# Patient Record
Sex: Female | Born: 1935
Health system: Southern US, Community
[De-identification: ages and names within clinical notes are randomized; demographics above are authoritative.]

## PROBLEM LIST (undated history)

## (undated) DIAGNOSIS — I10 Essential (primary) hypertension: Secondary | ICD-10-CM

## (undated) DIAGNOSIS — E785 Hyperlipidemia, unspecified: Secondary | ICD-10-CM

## (undated) DIAGNOSIS — M1990 Unspecified osteoarthritis, unspecified site: Secondary | ICD-10-CM

## (undated) DIAGNOSIS — Z973 Presence of spectacles and contact lenses: Secondary | ICD-10-CM

## (undated) HISTORY — PX: ABDOMINAL HYSTERECTOMY: SHX81

---

## 1999-03-22 ENCOUNTER — Ambulatory Visit (HOSPITAL_COMMUNITY): Admission: RE | Admit: 1999-03-22 | Discharge: 1999-03-22 | Payer: Self-pay | Admitting: Family Medicine

## 1999-03-22 ENCOUNTER — Encounter: Payer: Self-pay | Admitting: Family Medicine

## 1999-03-27 ENCOUNTER — Encounter: Payer: Self-pay | Admitting: Family Medicine

## 1999-03-27 ENCOUNTER — Ambulatory Visit (HOSPITAL_COMMUNITY): Admission: RE | Admit: 1999-03-27 | Discharge: 1999-03-27 | Payer: Self-pay | Admitting: Family Medicine

## 1999-10-09 ENCOUNTER — Other Ambulatory Visit: Admission: RE | Admit: 1999-10-09 | Discharge: 1999-10-09 | Payer: Self-pay | Admitting: Family Medicine

## 2000-01-01 ENCOUNTER — Other Ambulatory Visit: Admission: RE | Admit: 2000-01-01 | Discharge: 2000-01-01 | Payer: Self-pay | Admitting: Family Medicine

## 2000-01-01 ENCOUNTER — Encounter: Payer: Self-pay | Admitting: Family Medicine

## 2000-01-01 ENCOUNTER — Encounter: Admission: RE | Admit: 2000-01-01 | Discharge: 2000-01-01 | Payer: Self-pay | Admitting: Family Medicine

## 2000-10-14 ENCOUNTER — Encounter: Admission: RE | Admit: 2000-10-14 | Discharge: 2001-01-12 | Payer: Self-pay | Admitting: Family Medicine

## 2002-06-16 ENCOUNTER — Encounter: Payer: Self-pay | Admitting: Family Medicine

## 2002-06-16 ENCOUNTER — Encounter: Admission: RE | Admit: 2002-06-16 | Discharge: 2002-06-16 | Payer: Self-pay | Admitting: Family Medicine

## 2006-07-29 ENCOUNTER — Encounter: Admission: RE | Admit: 2006-07-29 | Discharge: 2006-07-29 | Payer: Self-pay | Admitting: Family Medicine

## 2006-08-01 ENCOUNTER — Encounter: Admission: RE | Admit: 2006-08-01 | Discharge: 2006-08-01 | Payer: Self-pay | Admitting: Family Medicine

## 2007-02-16 ENCOUNTER — Encounter: Admission: RE | Admit: 2007-02-16 | Discharge: 2007-02-16 | Payer: Self-pay | Admitting: Family Medicine

## 2007-08-24 ENCOUNTER — Ambulatory Visit (HOSPITAL_COMMUNITY): Admission: RE | Admit: 2007-08-24 | Discharge: 2007-08-24 | Payer: Self-pay | Admitting: Ophthalmology

## 2008-04-25 ENCOUNTER — Emergency Department (HOSPITAL_COMMUNITY): Admission: EM | Admit: 2008-04-25 | Discharge: 2008-04-25 | Payer: Self-pay | Admitting: Emergency Medicine

## 2010-11-08 LAB — CREATININE, SERUM: GFR calc non Af Amer: 50 — ABNORMAL LOW

## 2011-10-30 ENCOUNTER — Encounter (HOSPITAL_BASED_OUTPATIENT_CLINIC_OR_DEPARTMENT_OTHER): Payer: Self-pay | Admitting: *Deleted

## 2011-10-30 NOTE — Progress Notes (Signed)
To come in for bmet-ekg Take only bp meds am of surgery-bring all meds

## 2011-10-31 ENCOUNTER — Encounter (HOSPITAL_BASED_OUTPATIENT_CLINIC_OR_DEPARTMENT_OTHER)
Admission: RE | Admit: 2011-10-31 | Discharge: 2011-10-31 | Disposition: A | Discharge status: Home / Self Care | Payer: Medicare Other | Source: Ambulatory Visit | Attending: Orthopedic Surgery | Admitting: Orthopedic Surgery

## 2011-10-31 LAB — BASIC METABOLIC PANEL
CO2: 24 mEq/L (ref 19–32)
Chloride: 102 mEq/L (ref 96–112)
GFR calc Af Amer: 74 mL/min — ABNORMAL LOW (ref >90)
Potassium: 3.8 mEq/L (ref 3.5–5.1)
Sodium: 136 mEq/L (ref 135–145)

## 2011-10-31 NOTE — Progress Notes (Signed)
Compared to old ekg form dr blands office no change,  ekg reviewed by dr crews cleared ekg

## 2011-11-04 NOTE — H&P (Signed)
  Teresa Wu is an 78 y.o. female.   Chief Complaint: c/o chronic and progressive numbness and tingling of the right hand both median and ulnar distribution   HPI: She has had a year history of numbness in her hands.  She has more numbness on the right than the left.  She has had type II diabetes for ten years. She is not sure what her last hemoglobin A1C was, but states it was "7 something".  She has history of hypertension, no history of renal disease.  She is unaware of any neuropathy symptoms in her feet.    Past Medical History  Diagnosis Date  . Hypertension   . Diabetes mellitus   . Arthritis   . Hyperlipemia   . Wears glasses     Past Surgical History  Procedure Date  . Abdominal hysterectomy     No family history on file. Social History:  reports that she has never smoked. She does not have any smokeless tobacco history on file. She reports that she does not drink alcohol or use illicit drugs.  Allergies: No Known Allergies  No prescriptions prior to admission    No results found for this or any previous visit (from the past 48 hour(s)).  No results found.   Pertinent items are noted in HPI.  Height 5\' 2"  (1.575 m), weight 75.751 kg (167 lb).  General appearance: alert Head: Normocephalic, without obvious abnormality Neck: supple, symmetrical, trachea midline Resp: clear to auscultation bilaterally Cardio: regular rate and rhythm GI: normal findings: bowel sounds normal Extremities:  Inspection of her hands reveals no thenar atrophy.  She has full range of motion of her fingers in flexion/extension.  There is no sign of stenosing tenosynovitis.  She has positive wrist flexion test bilaterally, positive Tinel's sign over the median nerve bilaterally. She does not have intrinsic atrophy.    She has osteoarthritis of multiple joints most notably at her thumb IP joint on the right and her ring ringer PIP joint.  She can close her fingers to the palm on the left and  on the right she cannot close her ring fingertip to the palm due to osteoarthritis at the ring finger PIP joint.  Plain films of her hands demonstrate diffuse osteoarthritis most notable at the ring finger PIP joint on the right and IP joints of the thumbs bilaterally.  Her carpus is well preserved bilaterally.  Dr. Johna Roles completed detailed electrodiagnostic studies. These revealed generalized neuropathy with slowing of the median and ulnar conductions. She has very significant slowing across her carpal tunnels bilaterally and moderately slowing across the cubital tunnel bilaterally.    Pulses: 2+ and symmetric Skin: normal Neurologic: Grossly normal    Assessment/Plan Impression:Bilateral carpal tunnel syndrome.Probable mild ulnar neuropathy vs. motor sensory                    polyneuropathy.  Plan: To the OR for right CTR and decompression ulnar nerve right cubital tunnel.The procedure, risks,benefits and post-op course were discussed with the patient at length and they were in agreement with the plan.     DASNOIT,Jonluke Cobbins J 11/04/2011, 5:01 PM     H&P documentation: 11/05/2011  -History and Physical Reviewed  -Patient has been re-examined  -No change in the plan of care  Wyn Forster, MD

## 2011-11-05 ENCOUNTER — Encounter (HOSPITAL_BASED_OUTPATIENT_CLINIC_OR_DEPARTMENT_OTHER)
Admission: RE | Disposition: A | Discharge status: Home / Self Care | Payer: Self-pay | Source: Ambulatory Visit | Attending: Orthopedic Surgery

## 2011-11-05 ENCOUNTER — Encounter (HOSPITAL_BASED_OUTPATIENT_CLINIC_OR_DEPARTMENT_OTHER): Payer: Self-pay | Admitting: Certified Registered"

## 2011-11-05 ENCOUNTER — Ambulatory Visit (HOSPITAL_BASED_OUTPATIENT_CLINIC_OR_DEPARTMENT_OTHER)
Admission: RE | Admit: 2011-11-05 | Discharge: 2011-11-05 | Disposition: A | Discharge status: Home / Self Care | Payer: Medicare Other | Source: Ambulatory Visit | Attending: Orthopedic Surgery | Admitting: Orthopedic Surgery

## 2011-11-05 ENCOUNTER — Ambulatory Visit (HOSPITAL_BASED_OUTPATIENT_CLINIC_OR_DEPARTMENT_OTHER): Payer: Medicare Other | Admitting: Certified Registered"

## 2011-11-05 ENCOUNTER — Encounter (HOSPITAL_BASED_OUTPATIENT_CLINIC_OR_DEPARTMENT_OTHER): Payer: Self-pay

## 2011-11-05 DIAGNOSIS — G5600 Carpal tunnel syndrome, unspecified upper limb: Secondary | ICD-10-CM | POA: Insufficient documentation

## 2011-11-05 DIAGNOSIS — Z01810 Encounter for preprocedural cardiovascular examination: Secondary | ICD-10-CM | POA: Insufficient documentation

## 2011-11-05 DIAGNOSIS — G5620 Lesion of ulnar nerve, unspecified upper limb: Secondary | ICD-10-CM | POA: Insufficient documentation

## 2011-11-05 DIAGNOSIS — Z01812 Encounter for preprocedural laboratory examination: Secondary | ICD-10-CM | POA: Insufficient documentation

## 2011-11-05 DIAGNOSIS — E785 Hyperlipidemia, unspecified: Secondary | ICD-10-CM | POA: Insufficient documentation

## 2011-11-05 DIAGNOSIS — E1142 Type 2 diabetes mellitus with diabetic polyneuropathy: Secondary | ICD-10-CM | POA: Insufficient documentation

## 2011-11-05 DIAGNOSIS — I10 Essential (primary) hypertension: Secondary | ICD-10-CM | POA: Insufficient documentation

## 2011-11-05 DIAGNOSIS — E1149 Type 2 diabetes mellitus with other diabetic neurological complication: Secondary | ICD-10-CM | POA: Insufficient documentation

## 2011-11-05 HISTORY — PX: ULNAR NERVE TRANSPOSITION: SHX2595

## 2011-11-05 HISTORY — PX: CARPAL TUNNEL RELEASE: SHX101

## 2011-11-05 HISTORY — DX: Hyperlipidemia, unspecified: E78.5

## 2011-11-05 HISTORY — DX: Essential (primary) hypertension: I10

## 2011-11-05 HISTORY — DX: Presence of spectacles and contact lenses: Z97.3

## 2011-11-05 HISTORY — DX: Unspecified osteoarthritis, unspecified site: M19.90

## 2011-11-05 LAB — GLUCOSE, CAPILLARY: Glucose-Capillary: 146 mg/dL — ABNORMAL HIGH (ref 70–99)

## 2011-11-05 SURGERY — CARPAL TUNNEL RELEASE
Anesthesia: General | Site: Hand | Laterality: Right | Wound class: Clean

## 2011-11-05 MED ORDER — MIDAZOLAM HCL 2 MG/2ML IJ SOLN: 1.0000 mg | INTRAMUSCULAR | Status: DC | PRN

## 2011-11-05 MED ORDER — HYDROMORPHONE HCL PF 1 MG/ML IJ SOLN: 0.2500 mg | INTRAMUSCULAR | Status: DC | PRN

## 2011-11-05 MED ORDER — PROMETHAZINE HCL 25 MG/ML IJ SOLN: 6.2500 mg | INTRAMUSCULAR | Status: DC | PRN

## 2011-11-05 MED ORDER — HYDROCODONE-ACETAMINOPHEN 5-325 MG PO TABS: ORAL_TABLET | ORAL | Status: DC

## 2011-11-05 MED ORDER — FENTANYL CITRATE 0.05 MG/ML IJ SOLN
INTRAMUSCULAR | Status: DC | PRN
  Administered 2011-11-05: 100 ug via INTRAVENOUS
  Administered 2011-11-05 (×2): 25 ug via INTRAVENOUS

## 2011-11-05 MED ORDER — LACTATED RINGERS IV SOLN
INTRAVENOUS | Status: DC
  Administered 2011-11-05: 09:00:00 via INTRAVENOUS

## 2011-11-05 MED ORDER — LIDOCAINE HCL 2 % IJ SOLN
INTRAMUSCULAR | Status: DC | PRN
  Administered 2011-11-05: 5 mL

## 2011-11-05 MED ORDER — EPHEDRINE SULFATE 50 MG/ML IJ SOLN
INTRAMUSCULAR | Status: DC | PRN
  Administered 2011-11-05: 10 mg via INTRAVENOUS

## 2011-11-05 MED ORDER — PROPOFOL 10 MG/ML IV BOLUS
INTRAVENOUS | Status: DC | PRN
  Administered 2011-11-05: 140 mg via INTRAVENOUS

## 2011-11-05 MED ORDER — ONDANSETRON HCL 4 MG/2ML IJ SOLN
INTRAMUSCULAR | Status: DC | PRN
  Administered 2011-11-05: 4 mg via INTRAVENOUS

## 2011-11-05 MED ORDER — LIDOCAINE HCL (CARDIAC) 20 MG/ML IV SOLN
INTRAVENOUS | Status: DC | PRN
  Administered 2011-11-05: 60 mg via INTRAVENOUS

## 2011-11-05 MED ORDER — FENTANYL CITRATE 0.05 MG/ML IJ SOLN: 50.0000 ug | Freq: Once | INTRAMUSCULAR | Status: DC

## 2011-11-05 SURGICAL SUPPLY — 49 items
BANDAGE ADHESIVE 1X3 (GAUZE/BANDAGES/DRESSINGS) IMPLANT
BANDAGE ELASTIC 3 VELCRO ST LF (GAUZE/BANDAGES/DRESSINGS) ×3 IMPLANT
BANDAGE ELASTIC 4 VELCRO ST LF (GAUZE/BANDAGES/DRESSINGS) ×3 IMPLANT
BLADE MINI RND TIP GREEN BEAV (BLADE) IMPLANT
BLADE SURG 15 STRL LF DISP TIS (BLADE) ×2 IMPLANT
BLADE SURG 15 STRL SS (BLADE) ×1
BNDG ESMARK 4X9 LF (GAUZE/BANDAGES/DRESSINGS) ×3 IMPLANT
BRUSH SCRUB EZ PLAIN DRY (MISCELLANEOUS) ×3 IMPLANT
CLOTH BEACON ORANGE TIMEOUT ST (SAFETY) ×3 IMPLANT
CORDS BIPOLAR (ELECTRODE) ×3 IMPLANT
COVER MAYO STAND STRL (DRAPES) ×3 IMPLANT
COVER TABLE BACK 60X90 (DRAPES) ×3 IMPLANT
CUFF TOURNIQUET SINGLE 18IN (TOURNIQUET CUFF) IMPLANT
CUFF TOURNIQUET SINGLE 24IN (TOURNIQUET CUFF) ×3 IMPLANT
DECANTER SPIKE VIAL GLASS SM (MISCELLANEOUS) IMPLANT
DRAPE EXTREMITY T 121X128X90 (DRAPE) ×3 IMPLANT
DRAPE SURG 17X23 STRL (DRAPES) ×3 IMPLANT
DRSG TEGADERM 4X4.75 (GAUZE/BANDAGES/DRESSINGS) ×3 IMPLANT
GAUZE SPONGE 4X4 12PLY STRL LF (GAUZE/BANDAGES/DRESSINGS) ×3 IMPLANT
GLOVE BIOGEL M STRL SZ7.5 (GLOVE) ×3 IMPLANT
GLOVE ECLIPSE 6.5 STRL STRAW (GLOVE) ×3 IMPLANT
GLOVE EXAM NITRILE MD LF STRL (GLOVE) ×3 IMPLANT
GLOVE ORTHO TXT STRL SZ7.5 (GLOVE) ×3 IMPLANT
GOWN PREVENTION PLUS XLARGE (GOWN DISPOSABLE) ×3 IMPLANT
GOWN PREVENTION PLUS XXLARGE (GOWN DISPOSABLE) ×6 IMPLANT
LOOP VESSEL MAXI BLUE (MISCELLANEOUS) IMPLANT
NEEDLE 27GAX1X1/2 (NEEDLE) ×3 IMPLANT
PACK BASIN DAY SURGERY FS (CUSTOM PROCEDURE TRAY) ×3 IMPLANT
PAD CAST 3X4 CTTN HI CHSV (CAST SUPPLIES) ×2 IMPLANT
PADDING CAST ABS 4INX4YD NS (CAST SUPPLIES) ×1
PADDING CAST ABS COTTON 4X4 ST (CAST SUPPLIES) ×2 IMPLANT
PADDING CAST COTTON 3X4 STRL (CAST SUPPLIES) ×1
SLEEVE SCD COMPRESS KNEE MED (MISCELLANEOUS) ×3 IMPLANT
SPLINT PLASTER CAST XFAST 3X15 (CAST SUPPLIES) ×10 IMPLANT
SPLINT PLASTER XTRA FASTSET 3X (CAST SUPPLIES) ×5
SPONGE GAUZE 4X4 12PLY (GAUZE/BANDAGES/DRESSINGS) ×3 IMPLANT
STOCKINETTE 4X48 STRL (DRAPES) ×3 IMPLANT
STRIP CLOSURE SKIN 1/2X4 (GAUZE/BANDAGES/DRESSINGS) ×3 IMPLANT
SUT PROLENE 3 0 PS 2 (SUTURE) ×3 IMPLANT
SUT VIC AB 3-0 X1 27 (SUTURE) ×3 IMPLANT
SUT VIC AB 4-0 P-3 18XBRD (SUTURE) IMPLANT
SUT VIC AB 4-0 P3 18 (SUTURE)
SYR 3ML 23GX1 SAFETY (SYRINGE) IMPLANT
SYR BULB 3OZ (MISCELLANEOUS) ×3 IMPLANT
SYR CONTROL 10ML LL (SYRINGE) ×3 IMPLANT
TOWEL OR 17X24 6PK STRL BLUE (TOWEL DISPOSABLE) ×6 IMPLANT
TRAY DSU PREP LF (CUSTOM PROCEDURE TRAY) ×3 IMPLANT
UNDERPAD 30X30 INCONTINENT (UNDERPADS AND DIAPERS) ×3 IMPLANT
WATER STERILE IRR 1000ML POUR (IV SOLUTION) IMPLANT

## 2011-11-05 NOTE — Transfer of Care (Signed)
Immediate Anesthesia Transfer of Care Note  Patient: Teresa Wu  Procedure(s) Performed: Procedure(s) (LRB) with comments: CARPAL TUNNEL RELEASE (Right) ULNAR NERVE DECOMPRESSION/TRANSPOSITION (Right) - Decompression right ulnar nerve at elbow  Patient Location: PACU  Anesthesia Type: General  Level of Consciousness: sedated and unresponsive  Airway & Oxygen Therapy: Patient Spontanous Breathing and Patient connected to face mask oxygen  Post-op Assessment: Report given to PACU RN and Post -op Vital signs reviewed and stable  Post vital signs: Reviewed and stable  Complications: No apparent anesthesia complications

## 2011-11-05 NOTE — Brief Op Note (Signed)
11/05/2011  9:42 AM  PATIENT:  Teresa Wu  78 y.o. female  PRE-OPERATIVE DIAGNOSIS:  Right carpal tunnel syndrome, right ulnar nerve  POST-OPERATIVE DIAGNOSIS:  Right carpal tunnel syndrome, right ulnar nerve  PROCEDURE:  Procedure(s) (LRB) with comments: CARPAL TUNNEL RELEASE (Right) ULNAR NERVE DECOMPRESSION/TRANSPOSITION (Right) - Decompression right ulnar nerve at elbow  SURGEON:  Surgeon(s) and Role:    * Wyn Forster., MD - Primary  PHYSICIAN ASSISTANT:   ASSISTANTS:Jaelyn Cloninger Dasnoit,P.A-C   ANESTHESIA:   general  EBL:  Total I/O In: 800 [I.V.:800] Out: -   BLOOD ADMINISTERED:none  DRAINS: none   LOCAL MEDICATIONS USED:  LIDOCAINE   SPECIMEN:  No Specimen  DISPOSITION OF SPECIMEN:  N/A  COUNTS:  YES  TOURNIQUET:  * Missing tourniquet times found for documented tourniquets in log:  [id number] *  DICTATION: .Other Dictation: Dictation Number [id number]  PLAN OF CARE: Discharge to home after PACU  PATIENT DISPOSITION:  PACU - hemodynamically stable.

## 2011-11-05 NOTE — Anesthesia Postprocedure Evaluation (Signed)
  Anesthesia Post-op Note  Patient: Teresa Wu  Procedure(s) Performed: Procedure(s) (LRB) with comments: CARPAL TUNNEL RELEASE (Right) ULNAR NERVE DECOMPRESSION/TRANSPOSITION (Right) - Decompression right ulnar nerve at elbow  Patient Location: PACU  Anesthesia Type: General  Level of Consciousness: awake  Airway and Oxygen Therapy: Patient Spontanous Breathing  Post-op Pain: mild  Post-op Assessment: Post-op Vital signs reviewed, Patient's Cardiovascular Status Stable, Respiratory Function Stable, Patent Airway, No signs of Nausea or vomiting and Pain level controlled  Post-op Vital Signs: stable  Complications: No apparent anesthesia complications

## 2011-11-05 NOTE — Anesthesia Preprocedure Evaluation (Signed)
Anesthesia Evaluation  Patient identified by MRN, date of birth, ID band Patient awake    Reviewed: Allergy & Precautions, H&P , NPO status , Patient's Chart, lab work & pertinent test results  Airway Mallampati: I TM Distance: >3 FB Neck ROM: Full    Dental   Pulmonary  breath sounds clear to auscultation        Cardiovascular hypertension, Rhythm:Regular Rate:Normal     Neuro/Psych    GI/Hepatic   Endo/Other  diabetes  Renal/GU      Musculoskeletal   Abdominal (+) + obese,   Peds  Hematology   Anesthesia Other Findings   Reproductive/Obstetrics                           Anesthesia Physical Anesthesia Plan  ASA: III  Anesthesia Plan: General   Post-op Pain Management:    Induction: Intravenous  Airway Management Planned: LMA  Additional Equipment:   Intra-op Plan:   Post-operative Plan: Extubation in OR  Informed Consent: I have reviewed the patients History and Physical, chart, labs and discussed the procedure including the risks, benefits and alternatives for the proposed anesthesia with the patient or authorized representative who has indicated his/her understanding and acceptance.     Plan Discussed with: CRNA and Surgeon  Anesthesia Plan Comments:         Anesthesia Quick Evaluation

## 2011-11-05 NOTE — Op Note (Signed)
[id number] 

## 2011-11-05 NOTE — Anesthesia Procedure Notes (Signed)
Procedure Name: LMA Insertion Date/Time: 11/05/2011 9:01 AM Performed by: Verlan Friends Pre-anesthesia Checklist: Patient identified, Emergency Drugs available, Suction available, Patient being monitored and Timeout performed Patient Re-evaluated:Patient Re-evaluated prior to inductionOxygen Delivery Method: Circle System Utilized Preoxygenation: Pre-oxygenation with 100% oxygen Intubation Type: IV induction Ventilation: Mask ventilation without difficulty LMA: LMA inserted LMA Size: 4.0 Number of attempts: 1 Airway Equipment and Method: bite block Placement Confirmation: positive ETCO2 Tube secured with: Tape Dental Injury: Teeth and Oropharynx as per pre-operative assessment  Comments: Inserted a #4 Supreme, poor fit and discarded.  Placed #4 LMA with good fit.

## 2011-11-06 ENCOUNTER — Encounter (HOSPITAL_BASED_OUTPATIENT_CLINIC_OR_DEPARTMENT_OTHER): Payer: Self-pay | Admitting: Orthopedic Surgery

## 2011-11-06 LAB — GLUCOSE, CAPILLARY

## 2011-11-06 NOTE — Op Note (Signed)
NAMEMARRION, FINAN                ACCOUNT NO.:  [id number]  MEDICAL RECORD NO.:  [id number]  LOCATION:                                 FACILITY:  PHYSICIAN:  Katy Fitch. Everlie Eble, M.D.      DATE OF BIRTH:  DATE OF PROCEDURE:  11/05/2011 DATE OF DISCHARGE:                              OPERATIVE REPORT   PREOPERATIVE DIAGNOSES:  Background diabetic neuropathy with superimposed right carpal tunnel syndrome and right ulnar neuropathy cubital tunnel documented by electrodiagnostic studies.  POSTOPERATIVE DIAGNOSES:  Background diabetic neuropathy with superimposed right carpal tunnel syndrome and right ulnar neuropathy cubital tunnel documented by electrodiagnostic studies.  OPERATION: 1. Release of right transverse carpal ligament. 2. Decompression of right ulnar nerve at cubital tunnel.  OPERATING SURGEON:  Katy Fitch. Porschea Borys, MD  ASSISTANT:  Marveen Reeks Dasnoit, PA-C  ANESTHESIA:  General by LMA.  SUPERVISING ANESTHESIOLOGIST:  Quita Skye. Krista Blue, MD  INDICATIONS:  Teresa Wu is a 78 year old woman referred through the courtesy of Dr. Renaye Rakers for evaluation and management of bilateral hand discomfort and numbness.  Ms. Genter has had type 2 diabetes that has been moderately controlled.  She has had a history of hand numbness. She is referred for evaluation of possible carpal tunnel syndrome. Clinical examination showed signs of probable diabetic neuropathy with acrodysesthesias and numbness in the median distribution of both hands. Electrodiagnostic studies confirmed very significant bilateral carpal tunnel syndrome, right worse than left.  She also had focal slowing across the ulnar nerves at the cubital tunnel.  We advised to proceed with simple decompression of the ulnar nerve at the right elbow and right carpal tunnel release.  After informed consent, she was brought to the operating at this time.  Preoperatively, she was carefully advised that with the degree  of neuropathy and diabetes as well as her magnitude of entrapment neuropathy, Ms. Olivier would need to wait at least 5 months to see the results of her carpal tunnel release surgery and as long as 18 months to see the full benefit of her ulnar decompression at the elbow.  Questions were invited and answered in detail.  PROCEDURE:  Laruth Ebner was brought to room 5 of the [facility] and placed in supine position on the operating table.  Following detailed anesthesia and informed consent by Dr. Krista Blue, general anesthesia by LMA technique was recommended and accepted.  In room 5 under Dr. Burnett Corrente supervision, general anesthesia by LMA technique was induced.  The right arm was prepped with Betadine soap and solution, sterilely draped.  A pneumatic tourniquet was applied to the proximal right brachium.  Following exsanguination of right arm with Esmarch bandage, proximal brachium was inflated to 220 mmHg.  Procedure commenced with routine surgical time-out.  Thereafter, a short incision was fashioned in line of the ring finger and the palm.  Subcutaneous tissues were carefully divided revealing palmar fascia.  This split longitudinally to the common sensory branch of median nerve.  These were followed back to the transverse carpal ligament which was gently isolated from the median nerve with a Insurance risk surveyor.  The transverse carpal ligament was then gently released with scissors extending into the  distal forearm. The volar forearm fascia was released subcutaneously.  This widely opened carpal canal.  There is clear compression at the level of the transverse carpal ligament, distal margin.  Bleeding points along the margin of the released ligament were electrocauterized with bipolar current followed by repair of the skin with intradermal 3-0 Prolene suture.  Attention was then directed to the medial aspect of the elbow.  The medial epicondyle was identified by  palpation.  A 2.5 cm incision was fashioned directly over the path of the ulnar nerve.  Subcutaneous tissues were carefully divided, dissecting through abundant subcutaneous adipose tissue.  We went through more than an inch of adipose tissue to reach the epicondyle.  We identified the ulnar nerve by palpation and subsequently released the fascia, the heads of the flexor carpi ulnaris, the arcuate ligament, and Osborne's band.  We used a Penfield 4 elevator to decompress the nerve proximally and distally.  A vascular release just proximal to the entrance of the cubital tunnel was electrocauterized and transected.  The nerve remained stable.  There was clear compression at the arcuate ligament.  There were no mass or other predicaments noted.  Bleeding points along the margin of the released ligament and soft tissue structures were electrocauterized with bipolar current followed by repair of the skin with subcutaneous 3-0 Vicryl and intradermal 3-0 Prolene with Steri-Strips.  For aftercare, Ms. Winkels was placed in a compressive dressing on her hand with a volar plaster splint maintaining the wrist in 15 degrees of dorsiflexion.  The elbow was dressed with sterile gauze, Tegaderm, and Ace wrap.  There were no apparent complications.  For aftercare, we will see her back for followup in our office in a week for suture removal from her palm and 2 weeks for suture removal from her elbow.  Questions were invited and answered in detail.  She was provided hydrocodone 5 mg 1 p.o. q.4-6 hours p.r.n. pain, 20 tablets.    Katy Fitch Dorance Spink, M.D.    RVS/MEDQ  D:  11/05/2011  T:  11/06/2011  Job:  [id number]  cc:   Renaye Rakers, M.D.

## 2014-02-18 DIAGNOSIS — I10 Essential (primary) hypertension: Secondary | ICD-10-CM | POA: Diagnosis not present

## 2014-02-18 DIAGNOSIS — E1165 Type 2 diabetes mellitus with hyperglycemia: Secondary | ICD-10-CM | POA: Diagnosis not present

## 2014-02-23 DIAGNOSIS — I10 Essential (primary) hypertension: Secondary | ICD-10-CM | POA: Diagnosis not present

## 2014-02-23 DIAGNOSIS — E782 Mixed hyperlipidemia: Secondary | ICD-10-CM | POA: Diagnosis not present

## 2014-02-23 DIAGNOSIS — M1250 Traumatic arthropathy, unspecified site: Secondary | ICD-10-CM | POA: Diagnosis not present

## 2014-03-16 DIAGNOSIS — H40033 Anatomical narrow angle, bilateral: Secondary | ICD-10-CM | POA: Diagnosis not present

## 2014-03-16 DIAGNOSIS — H2511 Age-related nuclear cataract, right eye: Secondary | ICD-10-CM | POA: Diagnosis not present

## 2014-05-04 DIAGNOSIS — E119 Type 2 diabetes mellitus without complications: Secondary | ICD-10-CM | POA: Diagnosis not present

## 2014-05-04 DIAGNOSIS — Z0000 Encounter for general adult medical examination without abnormal findings: Secondary | ICD-10-CM | POA: Diagnosis not present

## 2014-07-07 DIAGNOSIS — Z1231 Encounter for screening mammogram for malignant neoplasm of breast: Secondary | ICD-10-CM | POA: Diagnosis not present

## 2014-09-02 DIAGNOSIS — E1165 Type 2 diabetes mellitus with hyperglycemia: Secondary | ICD-10-CM | POA: Diagnosis not present

## 2014-09-02 DIAGNOSIS — M1250 Traumatic arthropathy, unspecified site: Secondary | ICD-10-CM | POA: Diagnosis not present

## 2014-09-02 DIAGNOSIS — I10 Essential (primary) hypertension: Secondary | ICD-10-CM | POA: Diagnosis not present

## 2014-09-02 DIAGNOSIS — E119 Type 2 diabetes mellitus without complications: Secondary | ICD-10-CM | POA: Diagnosis not present

## 2014-12-15 DIAGNOSIS — E119 Type 2 diabetes mellitus without complications: Secondary | ICD-10-CM | POA: Diagnosis not present

## 2014-12-15 DIAGNOSIS — H40053 Ocular hypertension, bilateral: Secondary | ICD-10-CM | POA: Diagnosis not present

## 2014-12-15 DIAGNOSIS — H2511 Age-related nuclear cataract, right eye: Secondary | ICD-10-CM | POA: Diagnosis not present

## 2015-01-03 DIAGNOSIS — I10 Essential (primary) hypertension: Secondary | ICD-10-CM | POA: Diagnosis not present

## 2015-01-03 DIAGNOSIS — M1250 Traumatic arthropathy, unspecified site: Secondary | ICD-10-CM | POA: Diagnosis not present

## 2015-01-03 DIAGNOSIS — E782 Mixed hyperlipidemia: Secondary | ICD-10-CM | POA: Diagnosis not present

## 2015-01-03 DIAGNOSIS — E119 Type 2 diabetes mellitus without complications: Secondary | ICD-10-CM | POA: Diagnosis not present

## 2015-01-10 DIAGNOSIS — E119 Type 2 diabetes mellitus without complications: Secondary | ICD-10-CM | POA: Diagnosis not present

## 2015-01-10 DIAGNOSIS — J393 Upper respiratory tract hypersensitivity reaction, site unspecified: Secondary | ICD-10-CM | POA: Diagnosis not present

## 2015-01-17 DIAGNOSIS — I10 Essential (primary) hypertension: Secondary | ICD-10-CM | POA: Diagnosis not present

## 2015-01-17 DIAGNOSIS — Z6831 Body mass index (BMI) 31.0-31.9, adult: Secondary | ICD-10-CM | POA: Diagnosis not present

## 2015-01-17 DIAGNOSIS — J393 Upper respiratory tract hypersensitivity reaction, site unspecified: Secondary | ICD-10-CM | POA: Diagnosis not present

## 2015-04-13 DIAGNOSIS — H35033 Hypertensive retinopathy, bilateral: Secondary | ICD-10-CM | POA: Diagnosis not present

## 2015-04-13 DIAGNOSIS — H40053 Ocular hypertension, bilateral: Secondary | ICD-10-CM | POA: Diagnosis not present

## 2015-04-13 DIAGNOSIS — E119 Type 2 diabetes mellitus without complications: Secondary | ICD-10-CM | POA: Diagnosis not present

## 2015-04-13 DIAGNOSIS — H2511 Age-related nuclear cataract, right eye: Secondary | ICD-10-CM | POA: Diagnosis not present

## 2015-05-01 DIAGNOSIS — J393 Upper respiratory tract hypersensitivity reaction, site unspecified: Secondary | ICD-10-CM | POA: Diagnosis not present

## 2015-05-01 DIAGNOSIS — M1250 Traumatic arthropathy, unspecified site: Secondary | ICD-10-CM | POA: Diagnosis not present

## 2015-05-01 DIAGNOSIS — I10 Essential (primary) hypertension: Secondary | ICD-10-CM | POA: Diagnosis not present

## 2015-05-01 DIAGNOSIS — E782 Mixed hyperlipidemia: Secondary | ICD-10-CM | POA: Diagnosis not present

## 2015-05-01 DIAGNOSIS — E119 Type 2 diabetes mellitus without complications: Secondary | ICD-10-CM | POA: Diagnosis not present

## 2015-05-03 DIAGNOSIS — Z0000 Encounter for general adult medical examination without abnormal findings: Secondary | ICD-10-CM | POA: Diagnosis not present

## 2015-05-03 DIAGNOSIS — N189 Chronic kidney disease, unspecified: Secondary | ICD-10-CM | POA: Diagnosis not present

## 2015-05-03 DIAGNOSIS — E119 Type 2 diabetes mellitus without complications: Secondary | ICD-10-CM | POA: Diagnosis not present

## 2015-05-03 DIAGNOSIS — E782 Mixed hyperlipidemia: Secondary | ICD-10-CM | POA: Diagnosis not present

## 2015-05-03 DIAGNOSIS — L853 Xerosis cutis: Secondary | ICD-10-CM | POA: Diagnosis not present

## 2015-05-15 DIAGNOSIS — H2511 Age-related nuclear cataract, right eye: Secondary | ICD-10-CM | POA: Diagnosis not present

## 2015-05-15 DIAGNOSIS — H25811 Combined forms of age-related cataract, right eye: Secondary | ICD-10-CM | POA: Diagnosis not present

## 2015-08-02 DIAGNOSIS — Z1231 Encounter for screening mammogram for malignant neoplasm of breast: Secondary | ICD-10-CM | POA: Diagnosis not present

## 2015-08-31 DIAGNOSIS — I10 Essential (primary) hypertension: Secondary | ICD-10-CM | POA: Diagnosis not present

## 2015-08-31 DIAGNOSIS — E119 Type 2 diabetes mellitus without complications: Secondary | ICD-10-CM | POA: Diagnosis not present

## 2015-08-31 DIAGNOSIS — E782 Mixed hyperlipidemia: Secondary | ICD-10-CM | POA: Diagnosis not present

## 2015-09-06 DIAGNOSIS — Z1212 Encounter for screening for malignant neoplasm of rectum: Secondary | ICD-10-CM | POA: Diagnosis not present

## 2015-09-06 DIAGNOSIS — Z1211 Encounter for screening for malignant neoplasm of colon: Secondary | ICD-10-CM | POA: Diagnosis not present

## 2015-11-16 DIAGNOSIS — Z961 Presence of intraocular lens: Secondary | ICD-10-CM | POA: Diagnosis not present

## 2015-11-16 DIAGNOSIS — E119 Type 2 diabetes mellitus without complications: Secondary | ICD-10-CM | POA: Diagnosis not present

## 2015-11-16 DIAGNOSIS — H25812 Combined forms of age-related cataract, left eye: Secondary | ICD-10-CM | POA: Diagnosis not present

## 2015-12-11 DIAGNOSIS — H2512 Age-related nuclear cataract, left eye: Secondary | ICD-10-CM | POA: Diagnosis not present

## 2015-12-11 DIAGNOSIS — H25812 Combined forms of age-related cataract, left eye: Secondary | ICD-10-CM | POA: Diagnosis not present

## 2016-01-08 DIAGNOSIS — M1250 Traumatic arthropathy, unspecified site: Secondary | ICD-10-CM | POA: Diagnosis not present

## 2016-01-08 DIAGNOSIS — E119 Type 2 diabetes mellitus without complications: Secondary | ICD-10-CM | POA: Diagnosis not present

## 2016-01-08 DIAGNOSIS — N189 Chronic kidney disease, unspecified: Secondary | ICD-10-CM | POA: Diagnosis not present

## 2016-01-08 DIAGNOSIS — I10 Essential (primary) hypertension: Secondary | ICD-10-CM | POA: Diagnosis not present

## 2016-01-08 DIAGNOSIS — Z23 Encounter for immunization: Secondary | ICD-10-CM | POA: Diagnosis not present

## 2016-05-08 DIAGNOSIS — I10 Essential (primary) hypertension: Secondary | ICD-10-CM | POA: Diagnosis not present

## 2016-05-08 DIAGNOSIS — E119 Type 2 diabetes mellitus without complications: Secondary | ICD-10-CM | POA: Diagnosis not present

## 2016-05-08 DIAGNOSIS — N189 Chronic kidney disease, unspecified: Secondary | ICD-10-CM | POA: Diagnosis not present

## 2016-05-08 DIAGNOSIS — M1250 Traumatic arthropathy, unspecified site: Secondary | ICD-10-CM | POA: Diagnosis not present

## 2016-05-08 DIAGNOSIS — E782 Mixed hyperlipidemia: Secondary | ICD-10-CM | POA: Diagnosis not present

## 2016-05-20 DIAGNOSIS — E119 Type 2 diabetes mellitus without complications: Secondary | ICD-10-CM | POA: Diagnosis not present

## 2016-05-20 DIAGNOSIS — J399 Disease of upper respiratory tract, unspecified: Secondary | ICD-10-CM | POA: Diagnosis not present

## 2016-05-20 DIAGNOSIS — I10 Essential (primary) hypertension: Secondary | ICD-10-CM | POA: Diagnosis not present

## 2016-05-20 DIAGNOSIS — E782 Mixed hyperlipidemia: Secondary | ICD-10-CM | POA: Diagnosis not present

## 2016-05-31 ENCOUNTER — Ambulatory Visit
Admission: RE | Admit: 2016-05-31 | Discharge: 2016-05-31 | Disposition: A | Discharge status: Home / Self Care | Payer: Medicare Other | Source: Ambulatory Visit | Attending: Family Medicine | Admitting: Family Medicine

## 2016-05-31 ENCOUNTER — Other Ambulatory Visit: Payer: Self-pay | Admitting: Family Medicine

## 2016-05-31 DIAGNOSIS — R0989 Other specified symptoms and signs involving the circulatory and respiratory systems: Principal | ICD-10-CM

## 2016-05-31 DIAGNOSIS — Z0000 Encounter for general adult medical examination without abnormal findings: Secondary | ICD-10-CM | POA: Diagnosis not present

## 2016-05-31 DIAGNOSIS — R0689 Other abnormalities of breathing: Secondary | ICD-10-CM

## 2016-05-31 DIAGNOSIS — N189 Chronic kidney disease, unspecified: Secondary | ICD-10-CM | POA: Diagnosis not present

## 2016-05-31 DIAGNOSIS — J1289 Other viral pneumonia: Secondary | ICD-10-CM | POA: Diagnosis not present

## 2016-05-31 DIAGNOSIS — I10 Essential (primary) hypertension: Secondary | ICD-10-CM | POA: Diagnosis not present

## 2016-05-31 DIAGNOSIS — E119 Type 2 diabetes mellitus without complications: Secondary | ICD-10-CM | POA: Diagnosis not present

## 2016-05-31 DIAGNOSIS — R05 Cough: Secondary | ICD-10-CM | POA: Diagnosis not present

## 2016-06-20 DIAGNOSIS — I10 Essential (primary) hypertension: Secondary | ICD-10-CM | POA: Diagnosis not present

## 2016-06-20 DIAGNOSIS — E119 Type 2 diabetes mellitus without complications: Secondary | ICD-10-CM | POA: Diagnosis not present

## 2016-06-20 DIAGNOSIS — N189 Chronic kidney disease, unspecified: Secondary | ICD-10-CM | POA: Diagnosis not present

## 2016-08-22 DIAGNOSIS — E119 Type 2 diabetes mellitus without complications: Secondary | ICD-10-CM | POA: Diagnosis not present

## 2016-08-22 DIAGNOSIS — I10 Essential (primary) hypertension: Secondary | ICD-10-CM | POA: Diagnosis not present

## 2016-08-22 DIAGNOSIS — N189 Chronic kidney disease, unspecified: Secondary | ICD-10-CM | POA: Diagnosis not present

## 2016-09-05 DIAGNOSIS — I10 Essential (primary) hypertension: Secondary | ICD-10-CM | POA: Diagnosis not present

## 2016-09-05 DIAGNOSIS — N189 Chronic kidney disease, unspecified: Secondary | ICD-10-CM | POA: Diagnosis not present

## 2016-09-05 DIAGNOSIS — E119 Type 2 diabetes mellitus without complications: Secondary | ICD-10-CM | POA: Diagnosis not present

## 2016-12-05 DIAGNOSIS — E119 Type 2 diabetes mellitus without complications: Secondary | ICD-10-CM | POA: Diagnosis not present

## 2016-12-05 DIAGNOSIS — I10 Essential (primary) hypertension: Secondary | ICD-10-CM | POA: Diagnosis not present

## 2016-12-05 DIAGNOSIS — N189 Chronic kidney disease, unspecified: Secondary | ICD-10-CM | POA: Diagnosis not present

## 2016-12-05 DIAGNOSIS — Z23 Encounter for immunization: Secondary | ICD-10-CM | POA: Diagnosis not present

## 2017-01-17 DIAGNOSIS — H04123 Dry eye syndrome of bilateral lacrimal glands: Secondary | ICD-10-CM | POA: Diagnosis not present

## 2017-01-17 DIAGNOSIS — E119 Type 2 diabetes mellitus without complications: Secondary | ICD-10-CM | POA: Diagnosis not present

## 2017-01-17 DIAGNOSIS — Z961 Presence of intraocular lens: Secondary | ICD-10-CM | POA: Diagnosis not present

## 2017-02-12 DIAGNOSIS — I10 Essential (primary) hypertension: Secondary | ICD-10-CM | POA: Diagnosis not present

## 2017-02-12 DIAGNOSIS — E119 Type 2 diabetes mellitus without complications: Secondary | ICD-10-CM | POA: Diagnosis not present

## 2017-02-12 DIAGNOSIS — E782 Mixed hyperlipidemia: Secondary | ICD-10-CM | POA: Diagnosis not present

## 2017-02-12 DIAGNOSIS — N189 Chronic kidney disease, unspecified: Secondary | ICD-10-CM | POA: Diagnosis not present

## 2017-02-18 DIAGNOSIS — Z1231 Encounter for screening mammogram for malignant neoplasm of breast: Secondary | ICD-10-CM | POA: Diagnosis not present

## 2017-04-14 DIAGNOSIS — I10 Essential (primary) hypertension: Secondary | ICD-10-CM | POA: Diagnosis not present

## 2017-04-14 DIAGNOSIS — E119 Type 2 diabetes mellitus without complications: Secondary | ICD-10-CM | POA: Diagnosis not present

## 2017-04-14 DIAGNOSIS — E782 Mixed hyperlipidemia: Secondary | ICD-10-CM | POA: Diagnosis not present

## 2017-04-23 DIAGNOSIS — M9903 Segmental and somatic dysfunction of lumbar region: Secondary | ICD-10-CM | POA: Diagnosis not present

## 2017-04-23 DIAGNOSIS — M62830 Muscle spasm of back: Secondary | ICD-10-CM | POA: Diagnosis not present

## 2017-06-05 DIAGNOSIS — M1250 Traumatic arthropathy, unspecified site: Secondary | ICD-10-CM | POA: Diagnosis not present

## 2017-06-05 DIAGNOSIS — I10 Essential (primary) hypertension: Secondary | ICD-10-CM | POA: Diagnosis not present

## 2017-06-05 DIAGNOSIS — E119 Type 2 diabetes mellitus without complications: Secondary | ICD-10-CM | POA: Diagnosis not present

## 2017-08-29 DIAGNOSIS — I10 Essential (primary) hypertension: Secondary | ICD-10-CM | POA: Diagnosis not present

## 2017-08-29 DIAGNOSIS — E782 Mixed hyperlipidemia: Secondary | ICD-10-CM | POA: Diagnosis not present

## 2017-08-29 DIAGNOSIS — E119 Type 2 diabetes mellitus without complications: Secondary | ICD-10-CM | POA: Diagnosis not present

## 2017-08-29 DIAGNOSIS — M1250 Traumatic arthropathy, unspecified site: Secondary | ICD-10-CM | POA: Diagnosis not present

## 2017-09-04 DIAGNOSIS — I10 Essential (primary) hypertension: Secondary | ICD-10-CM | POA: Diagnosis not present

## 2017-09-04 DIAGNOSIS — E119 Type 2 diabetes mellitus without complications: Secondary | ICD-10-CM | POA: Diagnosis not present

## 2017-09-04 DIAGNOSIS — M1250 Traumatic arthropathy, unspecified site: Secondary | ICD-10-CM | POA: Diagnosis not present

## 2017-09-04 DIAGNOSIS — E782 Mixed hyperlipidemia: Secondary | ICD-10-CM | POA: Diagnosis not present

## 2018-01-05 DIAGNOSIS — E782 Mixed hyperlipidemia: Secondary | ICD-10-CM | POA: Diagnosis not present

## 2018-01-05 DIAGNOSIS — E119 Type 2 diabetes mellitus without complications: Secondary | ICD-10-CM | POA: Diagnosis not present

## 2018-01-05 DIAGNOSIS — I10 Essential (primary) hypertension: Secondary | ICD-10-CM | POA: Diagnosis not present

## 2018-01-05 DIAGNOSIS — Z0000 Encounter for general adult medical examination without abnormal findings: Secondary | ICD-10-CM | POA: Diagnosis not present

## 2018-01-05 DIAGNOSIS — L853 Xerosis cutis: Secondary | ICD-10-CM | POA: Diagnosis not present

## 2018-01-21 DIAGNOSIS — E119 Type 2 diabetes mellitus without complications: Secondary | ICD-10-CM | POA: Diagnosis not present

## 2018-01-21 DIAGNOSIS — H04123 Dry eye syndrome of bilateral lacrimal glands: Secondary | ICD-10-CM | POA: Diagnosis not present

## 2018-01-21 DIAGNOSIS — Z961 Presence of intraocular lens: Secondary | ICD-10-CM | POA: Diagnosis not present

## 2018-04-08 DIAGNOSIS — Z1231 Encounter for screening mammogram for malignant neoplasm of breast: Secondary | ICD-10-CM | POA: Diagnosis not present

## 2018-05-06 DIAGNOSIS — I10 Essential (primary) hypertension: Secondary | ICD-10-CM | POA: Diagnosis not present

## 2018-05-06 DIAGNOSIS — E1169 Type 2 diabetes mellitus with other specified complication: Secondary | ICD-10-CM | POA: Diagnosis not present

## 2018-05-06 DIAGNOSIS — N189 Chronic kidney disease, unspecified: Secondary | ICD-10-CM | POA: Diagnosis not present

## 2018-05-06 DIAGNOSIS — E782 Mixed hyperlipidemia: Secondary | ICD-10-CM | POA: Diagnosis not present

## 2018-08-28 DIAGNOSIS — N189 Chronic kidney disease, unspecified: Secondary | ICD-10-CM | POA: Diagnosis not present

## 2018-08-28 DIAGNOSIS — I679 Cerebrovascular disease, unspecified: Secondary | ICD-10-CM | POA: Diagnosis not present

## 2018-08-28 DIAGNOSIS — I10 Essential (primary) hypertension: Secondary | ICD-10-CM | POA: Diagnosis not present

## 2018-08-28 DIAGNOSIS — E1169 Type 2 diabetes mellitus with other specified complication: Secondary | ICD-10-CM | POA: Diagnosis not present

## 2018-08-28 DIAGNOSIS — I615 Nontraumatic intracerebral hemorrhage, intraventricular: Secondary | ICD-10-CM | POA: Diagnosis not present

## 2018-08-28 DIAGNOSIS — M130 Polyarthritis, unspecified: Secondary | ICD-10-CM | POA: Diagnosis not present

## 2018-09-04 DIAGNOSIS — Z0000 Encounter for general adult medical examination without abnormal findings: Secondary | ICD-10-CM | POA: Diagnosis not present

## 2018-09-04 DIAGNOSIS — E1169 Type 2 diabetes mellitus with other specified complication: Secondary | ICD-10-CM | POA: Diagnosis not present

## 2018-09-04 DIAGNOSIS — M130 Polyarthritis, unspecified: Secondary | ICD-10-CM | POA: Diagnosis not present

## 2018-09-04 DIAGNOSIS — I10 Essential (primary) hypertension: Secondary | ICD-10-CM | POA: Diagnosis not present

## 2018-09-04 DIAGNOSIS — E782 Mixed hyperlipidemia: Secondary | ICD-10-CM | POA: Diagnosis not present

## 2018-10-06 DIAGNOSIS — E1169 Type 2 diabetes mellitus with other specified complication: Secondary | ICD-10-CM | POA: Diagnosis not present

## 2018-10-06 DIAGNOSIS — I10 Essential (primary) hypertension: Secondary | ICD-10-CM | POA: Diagnosis not present

## 2018-10-06 DIAGNOSIS — E785 Hyperlipidemia, unspecified: Secondary | ICD-10-CM | POA: Diagnosis not present

## 2018-11-06 DIAGNOSIS — E1169 Type 2 diabetes mellitus with other specified complication: Secondary | ICD-10-CM | POA: Diagnosis not present

## 2018-12-04 DIAGNOSIS — I10 Essential (primary) hypertension: Secondary | ICD-10-CM | POA: Diagnosis not present

## 2018-12-04 DIAGNOSIS — E1169 Type 2 diabetes mellitus with other specified complication: Secondary | ICD-10-CM | POA: Diagnosis not present

## 2019-01-14 DIAGNOSIS — I10 Essential (primary) hypertension: Secondary | ICD-10-CM | POA: Diagnosis not present

## 2019-01-14 DIAGNOSIS — E7800 Pure hypercholesterolemia, unspecified: Secondary | ICD-10-CM | POA: Diagnosis not present

## 2019-01-14 DIAGNOSIS — E1169 Type 2 diabetes mellitus with other specified complication: Secondary | ICD-10-CM | POA: Diagnosis not present

## 2019-01-14 DIAGNOSIS — E785 Hyperlipidemia, unspecified: Secondary | ICD-10-CM | POA: Diagnosis not present

## 2019-02-16 DIAGNOSIS — E785 Hyperlipidemia, unspecified: Secondary | ICD-10-CM | POA: Diagnosis not present

## 2019-02-16 DIAGNOSIS — E1169 Type 2 diabetes mellitus with other specified complication: Secondary | ICD-10-CM | POA: Diagnosis not present

## 2019-03-09 DIAGNOSIS — H04123 Dry eye syndrome of bilateral lacrimal glands: Secondary | ICD-10-CM | POA: Diagnosis not present

## 2019-03-09 DIAGNOSIS — E119 Type 2 diabetes mellitus without complications: Secondary | ICD-10-CM | POA: Diagnosis not present

## 2019-03-09 DIAGNOSIS — Z961 Presence of intraocular lens: Secondary | ICD-10-CM | POA: Diagnosis not present

## 2019-04-08 DIAGNOSIS — R7309 Other abnormal glucose: Secondary | ICD-10-CM | POA: Diagnosis not present

## 2019-04-08 DIAGNOSIS — E785 Hyperlipidemia, unspecified: Secondary | ICD-10-CM | POA: Diagnosis not present

## 2019-04-08 DIAGNOSIS — E1169 Type 2 diabetes mellitus with other specified complication: Secondary | ICD-10-CM | POA: Diagnosis not present

## 2019-04-08 DIAGNOSIS — R799 Abnormal finding of blood chemistry, unspecified: Secondary | ICD-10-CM | POA: Diagnosis not present

## 2019-04-16 DIAGNOSIS — I10 Essential (primary) hypertension: Secondary | ICD-10-CM | POA: Diagnosis not present

## 2019-04-16 DIAGNOSIS — E1169 Type 2 diabetes mellitus with other specified complication: Secondary | ICD-10-CM | POA: Diagnosis not present

## 2019-05-24 DIAGNOSIS — Z1231 Encounter for screening mammogram for malignant neoplasm of breast: Secondary | ICD-10-CM | POA: Diagnosis not present

## 2019-06-14 DIAGNOSIS — I10 Essential (primary) hypertension: Secondary | ICD-10-CM | POA: Diagnosis not present

## 2019-06-14 DIAGNOSIS — E785 Hyperlipidemia, unspecified: Secondary | ICD-10-CM | POA: Diagnosis not present

## 2019-06-14 DIAGNOSIS — R7309 Other abnormal glucose: Secondary | ICD-10-CM | POA: Diagnosis not present

## 2019-06-14 DIAGNOSIS — R799 Abnormal finding of blood chemistry, unspecified: Secondary | ICD-10-CM | POA: Diagnosis not present

## 2019-06-14 DIAGNOSIS — E1169 Type 2 diabetes mellitus with other specified complication: Secondary | ICD-10-CM | POA: Diagnosis not present

## 2019-06-17 DIAGNOSIS — E785 Hyperlipidemia, unspecified: Secondary | ICD-10-CM | POA: Diagnosis not present

## 2019-06-17 DIAGNOSIS — E1169 Type 2 diabetes mellitus with other specified complication: Secondary | ICD-10-CM | POA: Diagnosis not present

## 2019-06-17 DIAGNOSIS — N189 Chronic kidney disease, unspecified: Secondary | ICD-10-CM | POA: Diagnosis not present

## 2019-06-17 DIAGNOSIS — I10 Essential (primary) hypertension: Secondary | ICD-10-CM | POA: Diagnosis not present

## 2019-08-11 DIAGNOSIS — M130 Polyarthritis, unspecified: Secondary | ICD-10-CM | POA: Diagnosis not present

## 2019-08-11 DIAGNOSIS — E119 Type 2 diabetes mellitus without complications: Secondary | ICD-10-CM | POA: Diagnosis not present

## 2019-08-11 DIAGNOSIS — I10 Essential (primary) hypertension: Secondary | ICD-10-CM | POA: Diagnosis not present

## 2019-09-14 DIAGNOSIS — E119 Type 2 diabetes mellitus without complications: Secondary | ICD-10-CM | POA: Diagnosis not present

## 2019-09-14 DIAGNOSIS — N189 Chronic kidney disease, unspecified: Secondary | ICD-10-CM | POA: Diagnosis not present

## 2019-09-14 DIAGNOSIS — E785 Hyperlipidemia, unspecified: Secondary | ICD-10-CM | POA: Diagnosis not present

## 2019-09-14 DIAGNOSIS — I10 Essential (primary) hypertension: Secondary | ICD-10-CM | POA: Diagnosis not present

## 2019-09-14 DIAGNOSIS — M130 Polyarthritis, unspecified: Secondary | ICD-10-CM | POA: Diagnosis not present

## 2019-09-17 DIAGNOSIS — E1169 Type 2 diabetes mellitus with other specified complication: Secondary | ICD-10-CM | POA: Diagnosis not present

## 2019-09-17 DIAGNOSIS — E785 Hyperlipidemia, unspecified: Secondary | ICD-10-CM | POA: Diagnosis not present

## 2019-09-17 DIAGNOSIS — M1250 Traumatic arthropathy, unspecified site: Secondary | ICD-10-CM | POA: Diagnosis not present

## 2019-09-17 DIAGNOSIS — I10 Essential (primary) hypertension: Secondary | ICD-10-CM | POA: Diagnosis not present

## 2019-10-26 DIAGNOSIS — E1169 Type 2 diabetes mellitus with other specified complication: Secondary | ICD-10-CM | POA: Diagnosis not present

## 2019-10-26 DIAGNOSIS — I10 Essential (primary) hypertension: Secondary | ICD-10-CM | POA: Diagnosis not present

## 2019-10-26 DIAGNOSIS — M150 Primary generalized (osteo)arthritis: Secondary | ICD-10-CM | POA: Diagnosis not present

## 2019-10-26 DIAGNOSIS — E785 Hyperlipidemia, unspecified: Secondary | ICD-10-CM | POA: Diagnosis not present

## 2019-12-24 DIAGNOSIS — E785 Hyperlipidemia, unspecified: Secondary | ICD-10-CM | POA: Diagnosis not present

## 2019-12-24 DIAGNOSIS — I10 Essential (primary) hypertension: Secondary | ICD-10-CM | POA: Diagnosis not present

## 2019-12-24 DIAGNOSIS — E1169 Type 2 diabetes mellitus with other specified complication: Secondary | ICD-10-CM | POA: Diagnosis not present

## 2019-12-28 DIAGNOSIS — E1169 Type 2 diabetes mellitus with other specified complication: Secondary | ICD-10-CM | POA: Diagnosis not present

## 2019-12-28 DIAGNOSIS — Z0000 Encounter for general adult medical examination without abnormal findings: Secondary | ICD-10-CM | POA: Diagnosis not present

## 2019-12-28 DIAGNOSIS — I10 Essential (primary) hypertension: Secondary | ICD-10-CM | POA: Diagnosis not present

## 2019-12-28 DIAGNOSIS — M150 Primary generalized (osteo)arthritis: Secondary | ICD-10-CM | POA: Diagnosis not present

## 2020-02-24 ENCOUNTER — Telehealth: Payer: Self-pay

## 2020-02-24 ENCOUNTER — Other Ambulatory Visit: Payer: Self-pay

## 2020-02-24 ENCOUNTER — Ambulatory Visit: Payer: Medicare Other | Admitting: Podiatry

## 2020-02-24 ENCOUNTER — Ambulatory Visit (INDEPENDENT_AMBULATORY_CARE_PROVIDER_SITE_OTHER): Payer: Medicare Other

## 2020-02-24 DIAGNOSIS — M1000 Idiopathic gout, unspecified site: Secondary | ICD-10-CM

## 2020-02-24 DIAGNOSIS — M79675 Pain in left toe(s): Secondary | ICD-10-CM | POA: Diagnosis not present

## 2020-02-24 DIAGNOSIS — M79674 Pain in right toe(s): Secondary | ICD-10-CM | POA: Diagnosis not present

## 2020-02-24 DIAGNOSIS — M779 Enthesopathy, unspecified: Secondary | ICD-10-CM

## 2020-02-24 MED ORDER — MELOXICAM 7.5 MG PO TABS: 7.5000 mg | ORAL_TABLET | Freq: Every day | ORAL | 0 refills | Status: AC

## 2020-02-24 MED ORDER — TRIAMCINOLONE ACETONIDE 10 MG/ML IJ SUSP
10.0000 mg | Freq: Once | INTRAMUSCULAR | Status: AC
  Administered 2020-02-24: 10 mg

## 2020-02-24 NOTE — Telephone Encounter (Signed)
Pt was called to remind to pick up blood work referral forms. Pt states understanding and is in agreement to pick up at front desk on 02/25/2019.

## 2020-02-25 DIAGNOSIS — M779 Enthesopathy, unspecified: Secondary | ICD-10-CM | POA: Diagnosis not present

## 2020-02-25 DIAGNOSIS — M1000 Idiopathic gout, unspecified site: Secondary | ICD-10-CM | POA: Diagnosis not present

## 2020-02-26 LAB — CBC WITH DIFFERENTIAL/PLATELET
Absolute Monocytes: 1032 cells/uL — ABNORMAL HIGH (ref 200–950)
Basophils Absolute: 39 cells/uL (ref 0–200)
Basophils Relative: 0.3 %
Eosinophils Absolute: 26 cells/uL (ref 15–500)
Eosinophils Relative: 0.2 %
HCT: 36.5 % (ref 35.0–45.0)
Hemoglobin: 12 g/dL (ref 11.7–15.5)
Lymphs Abs: 2890 cells/uL (ref 850–3900)
MCH: 29.1 pg (ref 27.0–33.0)
MCHC: 32.9 g/dL (ref 32.0–36.0)
MCV: 88.4 fL (ref 80.0–100.0)
MPV: 11.9 fL (ref 7.5–12.5)
Monocytes Relative: 8 %
Neutro Abs: 8914 cells/uL — ABNORMAL HIGH (ref 1500–7800)
Neutrophils Relative %: 69.1 %
Platelets: 286 10*3/uL (ref 140–400)
RBC: 4.13 10*6/uL (ref 3.80–5.10)
RDW: 13.3 % (ref 11.0–15.0)
Total Lymphocyte: 22.4 %
WBC: 12.9 10*3/uL — ABNORMAL HIGH (ref 3.8–10.8)

## 2020-02-26 LAB — BASIC METABOLIC PANEL
BUN/Creatinine Ratio: 22 (calc) (ref 6–22)
BUN: 31 mg/dL — ABNORMAL HIGH (ref 7–25)
CO2: 20 mmol/L (ref 20–32)
Calcium: 9.6 mg/dL (ref 8.6–10.4)
Chloride: 104 mmol/L (ref 98–110)
Creat: 1.42 mg/dL — ABNORMAL HIGH (ref 0.60–0.88)
Glucose, Bld: 162 mg/dL — ABNORMAL HIGH (ref 65–139)
Potassium: 4.5 mmol/L (ref 3.5–5.3)
Sodium: 137 mmol/L (ref 135–146)

## 2020-02-26 LAB — URIC ACID: Uric Acid, Serum: 9.2 mg/dL — ABNORMAL HIGH (ref 2.5–7.0)

## 2020-02-29 ENCOUNTER — Other Ambulatory Visit: Payer: Self-pay | Admitting: Podiatry

## 2020-02-29 MED ORDER — DOXYCYCLINE HYCLATE 100 MG PO TABS: 100.0000 mg | ORAL_TABLET | Freq: Two times a day (BID) | ORAL | 0 refills | Status: AC

## 2020-02-29 MED ORDER — COLCHICINE 0.6 MG PO TABS: 0.6000 mg | ORAL_TABLET | Freq: Every day | ORAL | 0 refills | Status: DC

## 2020-03-01 NOTE — Progress Notes (Signed)
Subjective:   Patient ID: Teresa Wu, female   DOB: 87 y.o.   MRN: [id number]   HPI 87 year old female presents the office with concerns of pain to her left foot mostly pointing to the medial aspect the first IPJ where she has majority discomfort.  She says the whole foot is been hurting this is started abruptly about 3 days ago.  She denies recent injury or trauma.  She thinks that she may have arthritis.  Denies any open sores or any drainage.  She has no other concerns today.   Review of Systems  All other systems reviewed and are negative.  Past Medical History:  Diagnosis Date  . Arthritis   . Diabetes mellitus   . Hyperlipemia   . Hypertension   . Wears glasses     Past Surgical History:  Procedure Laterality Date  . ABDOMINAL HYSTERECTOMY    . CARPAL TUNNEL RELEASE  11/05/2011   Procedure: CARPAL TUNNEL RELEASE;  Surgeon: Wyn Forster., MD;  Location: [facility];  Service: Orthopedics;  Laterality: Right;  . ULNAR NERVE TRANSPOSITION  11/05/2011   Procedure: ULNAR NERVE DECOMPRESSION/TRANSPOSITION;  Surgeon: Wyn Forster., MD;  Location: [facility];  Service: Orthopedics;  Laterality: Right;  Decompression right ulnar nerve at elbow     Current Outpatient Medications:  .  meloxicam (MOBIC) 7.5 MG tablet, Take 1 tablet (7.5 mg total) by mouth daily., Disp: 30 tablet, Rfl: 0 .  amLODipine (NORVASC) 10 MG tablet, Take 10 mg by mouth daily., Disp: , Rfl:  .  aspirin 81 MG tablet, Take 81 mg by mouth daily., Disp: , Rfl:  .  celecoxib (CELEBREX) 200 MG capsule, Take 200 mg by mouth daily., Disp: , Rfl:  .  colchicine 0.6 MG tablet, Take 1 tablet (0.6 mg total) by mouth daily., Disp: 7 tablet, Rfl: 0 .  doxycycline (VIBRA-TABS) 100 MG tablet, Take 1 tablet (100 mg total) by mouth 2 (two) times daily., Disp: 20 tablet, Rfl: 0 .  glimepiride (AMARYL) 2 MG tablet, Take 2 mg by mouth daily before breakfast., Disp: , Rfl:  .   glyBURIDE-metformin (GLUCOVANCE) 2.5-500 MG per tablet, Take 1 tablet by mouth daily with breakfast., Disp: , Rfl:  .  HYDROcodone-acetaminophen (NORCO/VICODIN) 5-325 MG per tablet, 1 tab every 4 hours as needed for pain, Disp: 24 tablet, Rfl: 0 .  lisinopril (PRINIVIL,ZESTRIL) 20 MG tablet, Take 20 mg by mouth daily., Disp: , Rfl:  .  metFORMIN (GLUCOPHAGE) 500 MG tablet, Take 500 mg by mouth at bedtime. Takes 2, Disp: , Rfl:  .  Multiple Vitamins-Minerals (MULTIVITAMIN WITH MINERALS) tablet, Take 1 tablet by mouth daily., Disp: , Rfl:  .  simvastatin (ZOCOR) 40 MG tablet, Take 40 mg by mouth every evening., Disp: , Rfl:   No Known Allergies        Objective:  Physical Exam  General: AAO x3, NAD  Dermatological: Skin is warm, dry and supple bilateral.  Mild erythema to medial first MPJ with localized edema and faint warmth.  There is no fluctuation, crepitation.  No ascending cellulitis.  There are no open sores, no preulcerative lesions, no rash or signs of infection present.  Vascular: Dorsalis Pedis artery and Posterior Tibial artery pedal pulses are 2/4 bilateral with immedate capillary fill time. There is no pain with calf compression, swelling, warmth, erythema.   Neruologic: Grossly intact via light touch bilateral.  Musculoskeletal: Mild diffuse tenderness to the left foot but the majority  of tenderness is localized on the medial aspect of her's MPJ.  Localized edema and faint erythema.  Mild warmth.  No ascending cellulitis.  No fluctuation, crepitation, malodor.  No open lesions.  Gait: Unassisted, Nonantalgic.       Assessment:   Left first MPJ capsulitis, likely gout     Plan:  -Treatment options discussed including all alternatives, risks, and complications -Etiology of symptoms were discussed -X-rays obtained reviewed.  No evidence of acute fracture, osteomyelitis. -Steroid injection performed to the medial first MPJ.  See procedure note below. -Prescribed mobic.  Discussed side effects of the medication and directed to stop if any are to occur and call the office.  -Blood work ordered including CBC, BMP, uric acid  Procedure: Injection small joint Discussed alternatives, risks, complications and verbal consent was obtained.  Location: Left first MPJ Skin Prep: Betadine. Injectate: 0.5cc 0.5% marcaine plain, 0.5 cc 2% lidocaine plain and, 1 cc kenalog 10. Disposition: Patient tolerated procedure well. Injection site dressed with a band-aid.  Post-injection care was discussed and return precautions discussed.   Return in about 3 weeks (around 03/16/2020).  Vivi Barrack DPM

## 2020-03-09 DIAGNOSIS — Z961 Presence of intraocular lens: Secondary | ICD-10-CM | POA: Diagnosis not present

## 2020-03-09 DIAGNOSIS — E119 Type 2 diabetes mellitus without complications: Secondary | ICD-10-CM | POA: Diagnosis not present

## 2020-03-09 DIAGNOSIS — H04123 Dry eye syndrome of bilateral lacrimal glands: Secondary | ICD-10-CM | POA: Diagnosis not present

## 2020-03-10 DIAGNOSIS — E1169 Type 2 diabetes mellitus with other specified complication: Secondary | ICD-10-CM | POA: Diagnosis not present

## 2020-03-10 DIAGNOSIS — M130 Polyarthritis, unspecified: Secondary | ICD-10-CM | POA: Diagnosis not present

## 2020-03-10 DIAGNOSIS — I10 Essential (primary) hypertension: Secondary | ICD-10-CM | POA: Diagnosis not present

## 2020-03-10 DIAGNOSIS — M109 Gout, unspecified: Secondary | ICD-10-CM | POA: Diagnosis not present

## 2020-03-11 DIAGNOSIS — M130 Polyarthritis, unspecified: Secondary | ICD-10-CM | POA: Diagnosis not present

## 2020-03-11 DIAGNOSIS — E119 Type 2 diabetes mellitus without complications: Secondary | ICD-10-CM | POA: Diagnosis not present

## 2020-03-11 DIAGNOSIS — I10 Essential (primary) hypertension: Secondary | ICD-10-CM | POA: Diagnosis not present

## 2020-03-17 ENCOUNTER — Other Ambulatory Visit: Payer: Self-pay | Admitting: Podiatry

## 2020-03-17 NOTE — Telephone Encounter (Signed)
Please advise 

## 2020-03-20 ENCOUNTER — Ambulatory Visit: Payer: Medicare Other | Admitting: Podiatry

## 2020-03-21 ENCOUNTER — Ambulatory Visit: Payer: Medicare Other | Admitting: Podiatry

## 2020-03-21 ENCOUNTER — Other Ambulatory Visit: Payer: Self-pay

## 2020-03-21 DIAGNOSIS — M1000 Idiopathic gout, unspecified site: Secondary | ICD-10-CM

## 2020-03-21 DIAGNOSIS — M779 Enthesopathy, unspecified: Secondary | ICD-10-CM | POA: Diagnosis not present

## 2020-03-21 MED ORDER — COLCHICINE 0.6 MG PO TABS: 0.6000 mg | ORAL_TABLET | Freq: Every day | ORAL | 0 refills | Status: AC

## 2020-03-22 LAB — CBC WITH DIFFERENTIAL/PLATELET
Absolute Monocytes: 798 cells/uL (ref 200–950)
Basophils Absolute: 53 cells/uL (ref 0–200)
Basophils Relative: 0.4 %
Eosinophils Absolute: 27 cells/uL (ref 15–500)
Eosinophils Relative: 0.2 %
HCT: 35.9 % (ref 35.0–45.0)
Hemoglobin: 11.9 g/dL (ref 11.7–15.5)
Lymphs Abs: 1902 cells/uL (ref 850–3900)
MCH: 29.4 pg (ref 27.0–33.0)
MCHC: 33.1 g/dL (ref 32.0–36.0)
MCV: 88.6 fL (ref 80.0–100.0)
MPV: 11.5 fL (ref 7.5–12.5)
Monocytes Relative: 6 %
Neutro Abs: 10520 cells/uL — ABNORMAL HIGH (ref 1500–7800)
Neutrophils Relative %: 79.1 %
Platelets: 266 10*3/uL (ref 140–400)
RBC: 4.05 10*6/uL (ref 3.80–5.10)
RDW: 13.4 % (ref 11.0–15.0)
Total Lymphocyte: 14.3 %
WBC: 13.3 10*3/uL — ABNORMAL HIGH (ref 3.8–10.8)

## 2020-03-22 LAB — BASIC METABOLIC PANEL
BUN/Creatinine Ratio: 16 (calc) (ref 6–22)
BUN: 26 mg/dL — ABNORMAL HIGH (ref 7–25)
CO2: 22 mmol/L (ref 20–32)
Calcium: 9.6 mg/dL (ref 8.6–10.4)
Chloride: 109 mmol/L (ref 98–110)
Creat: 1.64 mg/dL — ABNORMAL HIGH (ref 0.60–0.88)
Glucose, Bld: 141 mg/dL — ABNORMAL HIGH (ref 65–139)
Potassium: 5 mmol/L (ref 3.5–5.3)
Sodium: 141 mmol/L (ref 135–146)

## 2020-03-22 LAB — URIC ACID: Uric Acid, Serum: 8.4 mg/dL — ABNORMAL HIGH (ref 2.5–7.0)

## 2020-03-24 ENCOUNTER — Other Ambulatory Visit: Payer: Self-pay | Admitting: Podiatry

## 2020-03-24 MED ORDER — CEPHALEXIN 500 MG PO CAPS: 500.0000 mg | ORAL_CAPSULE | Freq: Two times a day (BID) | ORAL | 0 refills | Status: AC

## 2020-03-28 NOTE — Progress Notes (Signed)
Subjective: 87 year old female presents to the office today for follow-up evaluation plantar left foot, first MPJ.  She states that since starting the colchicine this was feeling better but she finished it.  Overall she is doing some better but still having discomfort.  No recent injury or falls. Denies any systemic complaints such as fevers, chills, nausea, vomiting. No acute changes since last appointment, and no other complaints at this time.   Objective: AAO x3, NAD DP/PT pulses palpable bilaterally, CRT less than 3 seconds There is localized edema and erythema along the first MPJ of the left side but overall improved.  There is still tenderness palpation mostly on the last of the first MPJ there is localized erythema to the area.  There is no ascending cellulitis there is no fluctuation crepitation, malodor. No pain with calf compression, swelling, warmth, erythema  Assessment: 87 year old female with capsulitis, likely gout  Plan: -All treatment options discussed with the patient including all alternatives, risks, complications.  -Overall doing better.  We will extend colchicine.  Recheck blood work.  No obvious open lesions or drainage or signs of infection to account for the increased white blood cell count. -Patient encouraged to call the office with any questions, concerns, change in symptoms.   Vivi Barrack DPM

## 2020-04-04 ENCOUNTER — Emergency Department (HOSPITAL_BASED_OUTPATIENT_CLINIC_OR_DEPARTMENT_OTHER)
Admission: EM | Admit: 2020-04-04 | Discharge: 2020-04-04 | Disposition: A | Discharge status: Home / Self Care | Payer: Medicare Other | Attending: Emergency Medicine | Admitting: Emergency Medicine

## 2020-04-04 ENCOUNTER — Encounter: Payer: Self-pay | Admitting: Emergency Medicine

## 2020-04-04 ENCOUNTER — Emergency Department (HOSPITAL_BASED_OUTPATIENT_CLINIC_OR_DEPARTMENT_OTHER): Payer: Medicare Other

## 2020-04-04 ENCOUNTER — Ambulatory Visit
Admission: EM | Admit: 2020-04-04 | Discharge: 2020-04-04 | Disposition: A | Discharge status: Other Acute Inpatient Hospital | Payer: Medicare Other

## 2020-04-04 ENCOUNTER — Encounter (HOSPITAL_BASED_OUTPATIENT_CLINIC_OR_DEPARTMENT_OTHER): Payer: Self-pay

## 2020-04-04 ENCOUNTER — Other Ambulatory Visit: Payer: Self-pay

## 2020-04-04 DIAGNOSIS — K5792 Diverticulitis of intestine, part unspecified, without perforation or abscess without bleeding: Secondary | ICD-10-CM | POA: Diagnosis not present

## 2020-04-04 DIAGNOSIS — R109 Unspecified abdominal pain: Secondary | ICD-10-CM

## 2020-04-04 DIAGNOSIS — I10 Essential (primary) hypertension: Secondary | ICD-10-CM | POA: Insufficient documentation

## 2020-04-04 DIAGNOSIS — M79672 Pain in left foot: Secondary | ICD-10-CM | POA: Insufficient documentation

## 2020-04-04 DIAGNOSIS — E119 Type 2 diabetes mellitus without complications: Secondary | ICD-10-CM | POA: Diagnosis not present

## 2020-04-04 DIAGNOSIS — M7989 Other specified soft tissue disorders: Secondary | ICD-10-CM | POA: Insufficient documentation

## 2020-04-04 DIAGNOSIS — K5732 Diverticulitis of large intestine without perforation or abscess without bleeding: Secondary | ICD-10-CM | POA: Insufficient documentation

## 2020-04-04 DIAGNOSIS — R1032 Left lower quadrant pain: Secondary | ICD-10-CM | POA: Diagnosis present

## 2020-04-04 DIAGNOSIS — I959 Hypotension, unspecified: Secondary | ICD-10-CM

## 2020-04-04 LAB — URINALYSIS, MICROSCOPIC (REFLEX)

## 2020-04-04 LAB — COMPREHENSIVE METABOLIC PANEL
ALT: 20 U/L (ref 0–44)
AST: 23 U/L (ref 15–41)
Albumin: 3.5 g/dL (ref 3.5–5.0)
Alkaline Phosphatase: 47 U/L (ref 38–126)
Anion gap: 11 (ref 5–15)
BUN: 37 mg/dL — ABNORMAL HIGH (ref 8–23)
CO2: 20 mmol/L — ABNORMAL LOW (ref 22–32)
Calcium: 9.1 mg/dL (ref 8.9–10.3)
Chloride: 104 mmol/L (ref 98–111)
Creatinine, Ser: 1.82 mg/dL — ABNORMAL HIGH (ref 0.44–1.00)
GFR, Estimated: 27 mL/min — ABNORMAL LOW (ref >60)
Glucose, Bld: 200 mg/dL — ABNORMAL HIGH (ref 70–99)
Potassium: 4.2 mmol/L (ref 3.5–5.1)
Sodium: 135 mmol/L (ref 135–145)
Total Bilirubin: 0.5 mg/dL (ref 0.3–1.2)
Total Protein: 7.4 g/dL (ref 6.5–8.1)

## 2020-04-04 LAB — CBC WITH DIFFERENTIAL/PLATELET
Abs Immature Granulocytes: 0.1 10*3/uL — ABNORMAL HIGH (ref 0.00–0.07)
Basophils Absolute: 0.1 10*3/uL (ref 0.0–0.1)
Basophils Relative: 0 %
Eosinophils Absolute: 0 10*3/uL (ref 0.0–0.5)
Eosinophils Relative: 0 %
HCT: 35.4 % — ABNORMAL LOW (ref 36.0–46.0)
Hemoglobin: 11.8 g/dL — ABNORMAL LOW (ref 12.0–15.0)
Immature Granulocytes: 1 %
Lymphocytes Relative: 10 %
Lymphs Abs: 1.5 10*3/uL (ref 0.7–4.0)
MCH: 29.4 pg (ref 26.0–34.0)
MCHC: 33.3 g/dL (ref 30.0–36.0)
MCV: 88.3 fL (ref 80.0–100.0)
Monocytes Absolute: 1.2 10*3/uL — ABNORMAL HIGH (ref 0.1–1.0)
Monocytes Relative: 8 %
Neutro Abs: 12.1 10*3/uL — ABNORMAL HIGH (ref 1.7–7.7)
Neutrophils Relative %: 81 %
Platelets: 274 10*3/uL (ref 150–400)
RBC: 4.01 MIL/uL (ref 3.87–5.11)
RDW: 14.2 % (ref 11.5–15.5)
WBC: 14.9 10*3/uL — ABNORMAL HIGH (ref 4.0–10.5)
nRBC: 0 % (ref 0.0–0.2)

## 2020-04-04 LAB — URINALYSIS, ROUTINE W REFLEX MICROSCOPIC
Bilirubin Urine: NEGATIVE
Glucose, UA: NEGATIVE mg/dL
Hgb urine dipstick: NEGATIVE
Ketones, ur: NEGATIVE mg/dL
Nitrite: NEGATIVE
Protein, ur: NEGATIVE mg/dL
Specific Gravity, Urine: 1.015 (ref 1.005–1.030)
pH: 5 (ref 5.0–8.0)

## 2020-04-04 LAB — LIPASE, BLOOD: Lipase: 30 U/L (ref 11–51)

## 2020-04-04 MED ORDER — HYDROCODONE-ACETAMINOPHEN 5-325 MG PO TABS: 1.0000 | ORAL_TABLET | Freq: Four times a day (QID) | ORAL | 0 refills | Status: AC | PRN

## 2020-04-04 MED ORDER — SODIUM CHLORIDE 0.9 % IV BOLUS
1000.0000 mL | Freq: Once | INTRAVENOUS | Status: AC
  Administered 2020-04-04: 1000 mL via INTRAVENOUS

## 2020-04-04 MED ORDER — AMOXICILLIN-POT CLAVULANATE 875-125 MG PO TABS: 1.0000 | ORAL_TABLET | Freq: Two times a day (BID) | ORAL | 0 refills | Status: AC

## 2020-04-04 MED ORDER — ONDANSETRON HCL 4 MG/2ML IJ SOLN
4.0000 mg | Freq: Once | INTRAMUSCULAR | Status: AC
  Administered 2020-04-04: 4 mg via INTRAVENOUS
  Filled 2020-04-04: qty 2

## 2020-04-04 MED ORDER — FENTANYL CITRATE (PF) 100 MCG/2ML IJ SOLN
25.0000 ug | Freq: Once | INTRAMUSCULAR | Status: AC
  Administered 2020-04-04: 25 ug via INTRAVENOUS
  Filled 2020-04-04: qty 2

## 2020-04-04 MED ORDER — SODIUM CHLORIDE 0.9 % IV BOLUS
500.0000 mL | Freq: Once | INTRAVENOUS | Status: AC
  Administered 2020-04-04: 500 mL via INTRAVENOUS

## 2020-04-04 MED ORDER — PIPERACILLIN-TAZOBACTAM 3.375 G IVPB 30 MIN
3.3750 g | Freq: Once | INTRAVENOUS | Status: AC
  Administered 2020-04-04: 3.375 g via INTRAVENOUS
  Filled 2020-04-04: qty 50

## 2020-04-04 NOTE — ED Notes (Signed)
Pt discharged to home with family. NAD.  

## 2020-04-04 NOTE — ED Provider Notes (Signed)
Emergency Department Provider Note   I have reviewed the triage vital signs and the nursing notes.   HISTORY  Chief Complaint Abdominal Pain and Foot Pain   HPI Teresa Wu is a 87 y.o. female with past medical history reviewed below presents to the emergency department from urgent care for evaluation of left lower quadrant abdominal pain.    Patient has been undergoing treatment of left foot pain which the patient reports was thought to be gout although is taking Keflex currently she has 1 dose of medication remaining.  She states overall her left foot pain and swelling have been improving.  She is developed left lower quadrant abdominal pain for the past 3 days.  She notes initially having diarrhea but has not had a bowel movement in the last 2 days.  She has not experienced fevers or chills.  No upper respiratory infection symptoms, cough, chest pain.  No prior history of diverticulitis.  Denies any dysuria, hesitancy, urgency other than some urine urgency at night which is not unusual for her. No sick contacts.   Past Medical History:  Diagnosis Date  . Arthritis   . Diabetes mellitus   . Hyperlipemia   . Hypertension   . Wears glasses     There are no problems to display for this patient.   Past Surgical History:  Procedure Laterality Date  . ABDOMINAL HYSTERECTOMY    . CARPAL TUNNEL RELEASE  11/05/2011   Procedure: CARPAL TUNNEL RELEASE;  Surgeon: Wyn Forster., MD;  Location: [facility];  Service: Orthopedics;  Laterality: Right;  . ULNAR NERVE TRANSPOSITION  11/05/2011   Procedure: ULNAR NERVE DECOMPRESSION/TRANSPOSITION;  Surgeon: Wyn Forster., MD;  Location: [facility];  Service: Orthopedics;  Laterality: Right;  Decompression right ulnar nerve at elbow    Allergies Patient has no known allergies.  No family history on file.  Social History Social History   Tobacco Use  . Smoking status: Never Smoker  . Smokeless  tobacco: Never Used  Substance Use Topics  . Alcohol use: No  . Drug use: No    Review of Systems  Constitutional: No fever/chills Eyes: No visual changes. ENT: No sore throat. Cardiovascular: Denies chest pain. Respiratory: Denies shortness of breath. Gastrointestinal: Positive LLQ abdominal pain.  No nausea, no vomiting.  Positive diarrhea (resolved).  No constipation. Genitourinary: Negative for dysuria. Musculoskeletal: Negative for back pain. Skin: Negative for rash. Neurological: Negative for headaches, focal weakness or numbness.  10-point ROS otherwise negative.  ____________________________________________   PHYSICAL EXAM:  VITAL SIGNS: ED Triage Vitals  Enc Vitals Group     BP 04/04/20 1322 127/62     Pulse Rate 04/04/20 1322 97     Resp 04/04/20 1322 20     Temp 04/04/20 1322 98.5 F (36.9 C)     Temp Source 04/04/20 1322 Oral     SpO2 04/04/20 1322 99 %     Weight 04/04/20 1323 157 lb (71.2 kg)     Height 04/04/20 1323 5\' 2"  (1.575 m)   Constitutional: Alert and oriented. Well appearing and in no acute distress. Eyes: Conjunctivae are normal.  Head: Atraumatic. Nose: No congestion/rhinnorhea. Mouth/Throat: Mucous membranes are moist.  Neck: No stridor.   Cardiovascular: Normal rate, regular rhythm. Good peripheral circulation. Grossly normal heart sounds.   Respiratory: Normal respiratory effort.  No retractions. Lungs CTAB. Gastrointestinal: Soft with focal tenderness in the LLQ. No rebound or guarding. Non-tender in the remaining abdomen.  No distention.  Musculoskeletal: No gross deformities of extremities. Neurologic:  Normal speech and language.  Skin:  Skin is warm, dry and intact. No rash noted.  ____________________________________________   LABS (all labs ordered are listed, but only abnormal results are displayed)  Labs Reviewed  URINE CULTURE - Abnormal; Notable for the following components:      Result Value   Culture MULTIPLE SPECIES  PRESENT, SUGGEST RECOLLECTION (*)    All other components within normal limits  COMPREHENSIVE METABOLIC PANEL - Abnormal; Notable for the following components:   CO2 20 (*)    Glucose, Bld 200 (*)    BUN 37 (*)    Creatinine, Ser 1.82 (*)    GFR, Estimated 27 (*)    All other components within normal limits  CBC WITH DIFFERENTIAL/PLATELET - Abnormal; Notable for the following components:   WBC 14.9 (*)    Hemoglobin 11.8 (*)    HCT 35.4 (*)    Neutro Abs 12.1 (*)    Monocytes Absolute 1.2 (*)    Abs Immature Granulocytes 0.10 (*)    All other components within normal limits  URINALYSIS, ROUTINE W REFLEX MICROSCOPIC - Abnormal; Notable for the following components:   APPearance HAZY (*)    Leukocytes,Ua MODERATE (*)    All other components within normal limits  URINALYSIS, MICROSCOPIC (REFLEX) - Abnormal; Notable for the following components:   Bacteria, UA RARE (*)    All other components within normal limits  LIPASE, BLOOD   ____________________________________________  RADIOLOGY  CT pending  ____________________________________________   PROCEDURES  Procedure(s) performed:   Procedures  None  ____________________________________________   INITIAL IMPRESSION / ASSESSMENT AND PLAN / ED COURSE  Pertinent labs & imaging results that were available during my care of the patient were reviewed by me and considered in my medical decision making (see chart for details).   Patient presents emergency department with abdominal pain over the past 3 days.  She is focally tender in the left lower quadrant.  She initially had some diarrhea but that is resolved.  She is on antibiotics but my suspicion for C. difficile colitis is very low given her focal tenderness and no bowel movement for the past 2 to 3 days.  Do plan for CT imaging of the abdomen pelvis to evaluate for possible diverticulitis +/- associated complication although my suspicion is low for abscess/perforation.  Exam  is not consistent with appendicitis/cholecystitis.  Patient's vital signs are within normal limits.  There is mention of urgent care of some low blood pressures with reading of systolic pressure in the 90s.  Patient has not tachycardic or hypoxemic. Doubt sepsis clinically. Will continue to follow.   Patient with leukocytosis to 14.9.  Has CKD not changed significantly from her baseline.  GFR too low to receive IV contrast.  Given her focal tenderness and leukocytosis I think her CT would benefit from some type of contrast and have ordered CT without IV but with p.o. contrast.  UA is pending.  CO2 of 20 but normal anion gap.  Glucose 200.   CT pending. Care transferred to Dr. Silverio Lay pending CT and reassessment.  ____________________________________________  FINAL CLINICAL IMPRESSION(S) / ED DIAGNOSES  Final diagnoses:  Diverticulitis     MEDICATIONS GIVEN DURING THIS VISIT:  Medications  sodium chloride 0.9 % bolus 500 mL (0 mLs Intravenous Stopped 04/04/20 1505)  fentaNYL (SUBLIMAZE) injection 25 mcg (25 mcg Intravenous Given 04/04/20 1354)  ondansetron (ZOFRAN) injection 4 mg (4 mg Intravenous Given 04/04/20  1354)  sodium chloride 0.9 % bolus 1,000 mL (0 mLs Intravenous Stopped 04/04/20 1612)  piperacillin-tazobactam (ZOSYN) IVPB 3.375 g (0 g Intravenous Stopped 04/04/20 1806)     NEW OUTPATIENT MEDICATIONS STARTED DURING THIS VISIT:  Discharge Medication List as of 04/04/2020  6:08 PM    START taking these medications   Details  amoxicillin-clavulanate (AUGMENTIN) 875-125 MG tablet Take 1 tablet by mouth 2 (two) times daily. One po bid x 7 days, Starting Tue 04/04/2020, Normal        Note:  This document was prepared using Dragon voice recognition software and may include unintentional dictation errors.  Alona Bene, MD, [facility]    Long, Arlyss Repress, MD 04/06/20 1726

## 2020-04-04 NOTE — Discharge Instructions (Addendum)
Take augmentin twice daily for diverticulitis.   Take tylenol for pain   Take vicodin for severe pain   See your doctor for follow up.   See GI for follow up for diverticulitis   Return to ER if you have worse abdominal pain, vomiting, fever

## 2020-04-04 NOTE — ED Triage Notes (Signed)
Patient c/o LFT sided lower ABD pain x 2 days.   Patient endorses having diarrhea upon onset of symptoms.   Patient denies any hematuria or  burning with urination.   Patient endorses increased urinary frequency.   Patient endorses having some back pain on the opposite sided (RT sided).     Patient c/o LFT sided foot pain that started this morning.   Patient endorses that "it feels like the gout attack I had a few months ago".   Patient has swelling present to the big toe.

## 2020-04-04 NOTE — ED Provider Notes (Signed)
  Physical Exam  BP 133/63   Pulse 98   Temp 98.5 F (36.9 C) (Oral)   Resp 18   Ht 5\' 2"  (1.575 m)   Wt 71.2 kg   SpO2 97%   BMI 28.72 kg/m   Physical Exam  ED Course/Procedures     Procedures  MDM  Patient care assumed at 3 PM.  Patient has left lower quadrant pain and WBC is 15.  Signout pending CT abdomen pelvis to rule out diverticulitis.  5:53 PM CT showed uncomplicated sigmoid diverticulitis.  Given Zosyn in the ED.  Will discharge home with Augmentin and GI follow-up.    , MD 04/04/20 [phone]

## 2020-04-04 NOTE — ED Notes (Signed)
Patient is being discharged from the Urgent Care and sent to the Emergency Department via Personal Vehicle w/ family member . Per Jerrilyn Cairo NP, patient is in need of higher level of care due to ABD Pain and Past Medical History. Patient is aware and verbalizes understanding of plan of care.  Vitals:   04/04/20 1153  BP: (!) 98/56  Pulse: 97  Resp: 20  Temp: 98.6 F (37 C)  SpO2: 97%

## 2020-04-04 NOTE — ED Triage Notes (Signed)
Rives with c/o pain in left foot has been treated with antibiotics and for gout to left foot with pain and swelling also c/o pain to LLQ X3 days, denies any NV, some diarrhea 4-5 days ago. Last BM 04-01-20

## 2020-04-04 NOTE — ED Provider Notes (Signed)
Patient presents to urgent care today for evaluation of left-sided lower abdominal pain which is worse over the course of 2 days and now has diarrhea accompanying abdominal pain symptoms.  Patient looks acutely ill and is having some generalized weakness and required wheelchair for transport back to the vehicle.  She is accompanied by family.  Reviewed lab work from 2 weeks ago patient had had an increased creatinine level and also had a mildly elevated white count following a procedure completed by her podiatrist.  Patient continues to have some left foot pain but the abdominal pain is new and she also has low blood pressure on arrival.  Given concern for possible sepsis given low blood pressure and abnormal labs from 2 weeks ago patient warrants emergent evaluation in the setting of the emergency department in which patient can have a real-time stat labs completed and advanced imaging of her abdomen available.  Family will transfer her immediately to [facility] for further work-up and evaluation.   Bing Neighbors, FNP 04/04/20 1230

## 2020-04-04 NOTE — Discharge Instructions (Signed)
Go immediately to [facility] emergency department for further work-up and evaluation of abdominal pain including diagnostic imaging and stat blood work to rule out any acute infection.  Have attached copies of patient's blood work from recent podiatry visit and highlighted the abnormal lab values.

## 2020-04-06 LAB — URINE CULTURE

## 2020-04-10 DIAGNOSIS — E119 Type 2 diabetes mellitus without complications: Secondary | ICD-10-CM | POA: Diagnosis not present

## 2020-04-18 ENCOUNTER — Ambulatory Visit: Payer: Medicare Other | Admitting: Podiatry

## 2020-04-18 ENCOUNTER — Other Ambulatory Visit: Payer: Self-pay

## 2020-04-18 ENCOUNTER — Ambulatory Visit (INDEPENDENT_AMBULATORY_CARE_PROVIDER_SITE_OTHER): Payer: Medicare Other

## 2020-04-18 DIAGNOSIS — M1000 Idiopathic gout, unspecified site: Secondary | ICD-10-CM

## 2020-04-18 DIAGNOSIS — M10072 Idiopathic gout, left ankle and foot: Secondary | ICD-10-CM

## 2020-04-18 DIAGNOSIS — M779 Enthesopathy, unspecified: Secondary | ICD-10-CM | POA: Diagnosis not present

## 2020-04-19 LAB — CBC WITH DIFFERENTIAL/PLATELET
Absolute Monocytes: 722 cells/uL (ref 200–950)
Basophils Absolute: 52 cells/uL (ref 0–200)
Basophils Relative: 0.4 %
Eosinophils Absolute: 39 cells/uL (ref 15–500)
Eosinophils Relative: 0.3 %
HCT: 38.5 % (ref 35.0–45.0)
Hemoglobin: 12.7 g/dL (ref 11.7–15.5)
Lymphs Abs: 1638 cells/uL (ref 850–3900)
MCH: 29.1 pg (ref 27.0–33.0)
MCHC: 33 g/dL (ref 32.0–36.0)
MCV: 88.3 fL (ref 80.0–100.0)
MPV: 11.6 fL (ref 7.5–12.5)
Monocytes Relative: 5.6 %
Neutro Abs: 10449 cells/uL — ABNORMAL HIGH (ref 1500–7800)
Neutrophils Relative %: 81 %
Platelets: 336 10*3/uL (ref 140–400)
RBC: 4.36 10*6/uL (ref 3.80–5.10)
RDW: 13.5 % (ref 11.0–15.0)
Total Lymphocyte: 12.7 %
WBC: 12.9 10*3/uL — ABNORMAL HIGH (ref 3.8–10.8)

## 2020-04-19 LAB — BASIC METABOLIC PANEL
BUN/Creatinine Ratio: 15 (calc) (ref 6–22)
BUN: 21 mg/dL (ref 7–25)
CO2: 23 mmol/L (ref 20–32)
Calcium: 10.1 mg/dL (ref 8.6–10.4)
Chloride: 104 mmol/L (ref 98–110)
Creat: 1.42 mg/dL — ABNORMAL HIGH (ref 0.60–0.88)
Glucose, Bld: 135 mg/dL — ABNORMAL HIGH (ref 65–99)
Potassium: 5.4 mmol/L — ABNORMAL HIGH (ref 3.5–5.3)
Sodium: 137 mmol/L (ref 135–146)

## 2020-04-19 LAB — URIC ACID: Uric Acid, Serum: 7.5 mg/dL — ABNORMAL HIGH (ref 2.5–7.0)

## 2020-04-23 NOTE — Progress Notes (Signed)
Subjective: 87 year old female presents the office today for follow-up evaluation of capsulitis, likely gout on the left foot.  She states that she is doing better but she still gets discomfort she points directly on the medial first MPJ.  The swelling has improved.  She has no other concerns today.  No recent injury. Denies any systemic complaints such as fevers, chills, nausea, vomiting. No acute changes since last appointment, and no other complaints at this time.   Objective: AAO x3, NAD DP/PT pulses palpable bilaterally, CRT less than 3 seconds There is tenderness along medial first metatarsal head.  There is minimal edema there is no erythema or warmth.  There is no open lesions there is no fluctuation crepitation.  No pain with calf compression, swelling, warmth, erythema  Assessment: Capsulitis left foot, likely gout  Plan: -All treatment options discussed with the patient including all alternatives, risks, complications.  -Repeat x-rays obtained reviewed.  No evidence of acute fracture, osteomyelitis or soft tissue emphysema. -Steroid injection performed.  The skin was prepped with Betadine and a mixture of 1 cc dexamethasone phosphate, 1 cc Marcaine plain was infiltrated into the area of maximal tenderness first MPJ medially.  She tolerated well with any complications.  Postinjection care discussed. -Recheck blood work -Patient encouraged to call the office with any questions, concerns, change in symptoms.   Vivi Barrack DPM

## 2020-05-11 DIAGNOSIS — M130 Polyarthritis, unspecified: Secondary | ICD-10-CM | POA: Diagnosis not present

## 2020-05-11 DIAGNOSIS — E119 Type 2 diabetes mellitus without complications: Secondary | ICD-10-CM | POA: Diagnosis not present

## 2020-05-11 DIAGNOSIS — I10 Essential (primary) hypertension: Secondary | ICD-10-CM | POA: Diagnosis not present

## 2020-05-12 ENCOUNTER — Telehealth: Payer: Self-pay | Admitting: *Deleted

## 2020-05-12 NOTE — Telephone Encounter (Signed)
Called and left a message for the patient to call me back. Jerilyn Gillaspie 

## 2020-06-06 DIAGNOSIS — E785 Hyperlipidemia, unspecified: Secondary | ICD-10-CM | POA: Diagnosis not present

## 2020-06-06 DIAGNOSIS — I10 Essential (primary) hypertension: Secondary | ICD-10-CM | POA: Diagnosis not present

## 2020-06-06 DIAGNOSIS — M130 Polyarthritis, unspecified: Secondary | ICD-10-CM | POA: Diagnosis not present

## 2020-06-06 DIAGNOSIS — E1169 Type 2 diabetes mellitus with other specified complication: Secondary | ICD-10-CM | POA: Diagnosis not present

## 2020-06-08 DIAGNOSIS — I10 Essential (primary) hypertension: Secondary | ICD-10-CM | POA: Diagnosis not present

## 2020-06-08 DIAGNOSIS — E1169 Type 2 diabetes mellitus with other specified complication: Secondary | ICD-10-CM | POA: Diagnosis not present

## 2020-06-08 DIAGNOSIS — N189 Chronic kidney disease, unspecified: Secondary | ICD-10-CM | POA: Diagnosis not present

## 2020-06-08 DIAGNOSIS — E785 Hyperlipidemia, unspecified: Secondary | ICD-10-CM | POA: Diagnosis not present

## 2020-06-08 DIAGNOSIS — E782 Mixed hyperlipidemia: Secondary | ICD-10-CM | POA: Diagnosis not present

## 2020-06-08 DIAGNOSIS — M1250 Traumatic arthropathy, unspecified site: Secondary | ICD-10-CM | POA: Diagnosis not present

## 2020-06-09 DIAGNOSIS — M1812 Unilateral primary osteoarthritis of first carpometacarpal joint, left hand: Secondary | ICD-10-CM | POA: Diagnosis not present

## 2020-06-09 DIAGNOSIS — M25572 Pain in left ankle and joints of left foot: Secondary | ICD-10-CM | POA: Diagnosis not present

## 2020-06-10 DIAGNOSIS — E119 Type 2 diabetes mellitus without complications: Secondary | ICD-10-CM | POA: Diagnosis not present

## 2020-06-10 DIAGNOSIS — M130 Polyarthritis, unspecified: Secondary | ICD-10-CM | POA: Diagnosis not present

## 2020-06-10 DIAGNOSIS — I10 Essential (primary) hypertension: Secondary | ICD-10-CM | POA: Diagnosis not present

## 2020-07-14 DIAGNOSIS — M1812 Unilateral primary osteoarthritis of first carpometacarpal joint, left hand: Secondary | ICD-10-CM | POA: Diagnosis not present

## 2020-08-18 DIAGNOSIS — U071 COVID-19: Secondary | ICD-10-CM | POA: Diagnosis not present

## 2020-08-18 DIAGNOSIS — Z20822 Contact with and (suspected) exposure to covid-19: Secondary | ICD-10-CM | POA: Diagnosis not present

## 2020-08-29 DIAGNOSIS — Z03818 Encounter for observation for suspected exposure to other biological agents ruled out: Secondary | ICD-10-CM | POA: Diagnosis not present

## 2020-08-29 DIAGNOSIS — Z20822 Contact with and (suspected) exposure to covid-19: Secondary | ICD-10-CM | POA: Diagnosis not present

## 2020-08-29 DIAGNOSIS — I10 Essential (primary) hypertension: Secondary | ICD-10-CM | POA: Diagnosis not present

## 2020-09-10 DIAGNOSIS — I10 Essential (primary) hypertension: Secondary | ICD-10-CM | POA: Diagnosis not present

## 2020-09-10 DIAGNOSIS — M130 Polyarthritis, unspecified: Secondary | ICD-10-CM | POA: Diagnosis not present

## 2020-09-10 DIAGNOSIS — E119 Type 2 diabetes mellitus without complications: Secondary | ICD-10-CM | POA: Diagnosis not present

## 2020-10-03 DIAGNOSIS — I10 Essential (primary) hypertension: Secondary | ICD-10-CM | POA: Diagnosis not present

## 2020-10-03 DIAGNOSIS — E782 Mixed hyperlipidemia: Secondary | ICD-10-CM | POA: Diagnosis not present

## 2020-10-03 DIAGNOSIS — N189 Chronic kidney disease, unspecified: Secondary | ICD-10-CM | POA: Diagnosis not present

## 2020-10-03 DIAGNOSIS — E119 Type 2 diabetes mellitus without complications: Secondary | ICD-10-CM | POA: Diagnosis not present

## 2020-10-09 DIAGNOSIS — I10 Essential (primary) hypertension: Secondary | ICD-10-CM | POA: Diagnosis not present

## 2020-10-09 DIAGNOSIS — E1169 Type 2 diabetes mellitus with other specified complication: Secondary | ICD-10-CM | POA: Diagnosis not present

## 2020-10-09 DIAGNOSIS — E785 Hyperlipidemia, unspecified: Secondary | ICD-10-CM | POA: Diagnosis not present

## 2020-10-09 DIAGNOSIS — N189 Chronic kidney disease, unspecified: Secondary | ICD-10-CM | POA: Diagnosis not present

## 2020-10-23 DIAGNOSIS — Z1231 Encounter for screening mammogram for malignant neoplasm of breast: Secondary | ICD-10-CM | POA: Diagnosis not present

## 2020-11-01 ENCOUNTER — Other Ambulatory Visit: Payer: Self-pay

## 2020-11-01 ENCOUNTER — Ambulatory Visit: Payer: Medicare Other | Attending: Family Medicine

## 2020-11-01 DIAGNOSIS — M5442 Lumbago with sciatica, left side: Secondary | ICD-10-CM | POA: Diagnosis not present

## 2020-11-01 DIAGNOSIS — R262 Difficulty in walking, not elsewhere classified: Secondary | ICD-10-CM | POA: Diagnosis not present

## 2020-11-01 DIAGNOSIS — M6281 Muscle weakness (generalized): Secondary | ICD-10-CM

## 2020-11-01 DIAGNOSIS — G8929 Other chronic pain: Secondary | ICD-10-CM | POA: Insufficient documentation

## 2020-11-01 DIAGNOSIS — R2689 Other abnormalities of gait and mobility: Secondary | ICD-10-CM

## 2020-11-02 NOTE — Therapy (Signed)
[facility] [address] [place], [place], [address] Phone: [phone]   Fax:  [phone]  Physical Therapy Evaluation  Patient Details  Name: Teresa Wu MRN: [id number] Date of Birth: [birth year] Referring Provider (PT): Renaye Rakers, MD   Encounter Date: 11/01/2020   PT End of Session - 11/02/20 0730     Visit Number 1    Number of Visits 17    Date for PT Re-Evaluation 01/06/21    Authorization Type [employer] MEDICARE    PT Start Time 1023    PT Stop Time 1107    PT Time Calculation (min) 44 min    Activity Tolerance Patient tolerated treatment well    Behavior During Therapy [facility] for tasks assessed/performed             Past Medical History:  Diagnosis Date   Arthritis    Diabetes mellitus    Hyperlipemia    Hypertension    Wears glasses     Past Surgical History:  Procedure Laterality Date   ABDOMINAL HYSTERECTOMY     CARPAL TUNNEL RELEASE  11/05/2011   Procedure: CARPAL TUNNEL RELEASE;  Surgeon: Wyn Forster., MD;  Location: [facility];  Service: Orthopedics;  Laterality: Right;   ULNAR NERVE TRANSPOSITION  11/05/2011   Procedure: ULNAR NERVE DECOMPRESSION/TRANSPOSITION;  Surgeon: Wyn Forster., MD;  Location: [facility];  Service: Orthopedics;  Laterality: Right;  Decompression right ulnar nerve at elbow    There were no vitals filed for this visit.    Subjective Assessment - 11/02/20 0835     Subjective Pt reports a 5 year Hx of low back pain L>R with L hip and LE pain.    Limitations Sitting;Standing;Walking    Patient Stated Goals To walk better with less pain    Currently in Pain? Yes    Pain Score 5    0-9/10 pain range   Pain Location Back    Pain Orientation Left;Right;Posterior;Lower    Pain Descriptors / Indicators Aching;Sharp    Pain Type Chronic pain    Pain Onset More than a month ago    Pain Frequency Intermittent    Aggravating Factors  sit  to stand, in the AM, prolonged standing and walking    Pain Relieving Factors Rest, medication                [facility] PT Assessment - 11/02/20 0001       Assessment   Medical Diagnosis Leg and muscle weakness    Referring Provider (PT) Renaye Rakers, MD    Onset Date/Surgical Date --   5 years   Hand Dominance Right    Prior Therapy no      Precautions   Precautions None      Restrictions   Weight Bearing Restrictions No      Balance Screen   Has the patient fallen in the past 6 months No      Home Environment   Living Environment Private residence    Living Arrangements Children    Type of Home House    Home Access Ramped entrance    Home Layout One level      Prior Function   Level of Independence Independent    Vocation Retired      IT consultant   Overall Cognitive Status Within Functional Limits for tasks assessed      Observation/Other Assessments   Focus on Therapeutic Outcomes (FOTO)  49% functional  ability      Sensation   Light Touch Appears Intact      Posture/Postural Control   Posture/Postural Control Postural limitations    Postural Limitations Rounded Shoulders;Forward head;Increased lumbar lordosis      ROM / Strength   AROM / PROM / Strength AROM;Strength      AROM   Overall AROM Comments All trunk movements reproduced stiffness and pain with L side bending and rotation being the most significant    AROM Assessment Site Lumbar    Lumbar Flexion 25% limitation    Lumbar Extension 25% limitation    Lumbar - Right Side Bend 25% limitation    Lumbar - Left Side Bend 25% limitation    Lumbar - Right Rotation 25% limitation    Lumbar - Left Rotation 25% limitation      Strength   Strength Assessment Site Hip;Knee;Ankle    Right/Left Hip Right;Left    Right Hip Flexion 4+/5    Right Hip Extension 4+/5    Right Hip External Rotation  4+/5    Right Hip Internal Rotation 5/5    Right Hip ABduction 4+/5    Right Hip ADduction 5/5    Left Hip  Flexion 4/5    Left Hip Extension 4/5    Left Hip External Rotation 4/5    Left Hip Internal Rotation 4/5    Left Hip ABduction 4/5    Left Hip ADduction 4/5    Right/Left Knee Right;Left    Right Knee Flexion 5/5    Right Knee Extension 5/5    Left Knee Flexion 4+/5    Left Knee Extension 4+/5    Right/Left Ankle Right;Left    Right Ankle Dorsiflexion 4+/5    Right Ankle Plantar Flexion 4+/5    Left Ankle Dorsiflexion 4+/5    Left Ankle Plantar Flexion 4+/5      Transfers   Transfers Sit to Stand;Stand to Sit    Sit to Stand 6: Modified independent (Device/Increase time)   increased time   Five time sit to stand comments  31.5 sec      Ambulation/Gait   Gait Pattern Step-through pattern;Antalgic   L LE                       Objective measurements completed on examination: See above findings.                PT Education - 11/02/20 0810     Education Details Eval findings, POC, HEP- see instructions    Person(s) Educated Patient    Methods Explanation;Demonstration;Tactile cues;Verbal cues;Handout    Comprehension Verbalized understanding;Returned demonstration;Verbal cues required;Tactile cues required              PT Short Term Goals - 11/02/20 0842       PT SHORT TERM GOAL #1   Title Pt will eb Ind in an initial HEP    Baseline Started on eval    Status New    Target Date 11/23/20      PT SHORT TERM GOAL #2   Title Pt will voice understanding of measures to assist with pain reduction    Period Days    Status New    Target Date 11/23/20               PT Long Term Goals - 11/02/20 0844       PT LONG TERM GOAL #1   Title Pt will be Ind in a  final HEP to maintain achieved LOF    Status New    Target Date 01/06/21      PT LONG TERM GOAL #2   Title Pt will reports improvement in her pain range with her dailiy activities to 5/10 or less and with 505 decreased frequency    Baseline 0-9/10    Status New    Target Date  01/06/21      PT LONG TERM GOAL #3   Title With reduction of pain, pt will walk with an improved gait pattern, without an antalgic pattern and improved tolerance    Baseline antaligic gait patttern    Status New    Target Date 01/06/21      PT LONG TERM GOAL #4   Title Pt will demonstrate an improved 5xSTS time to reflect improve pain and function to 24 sec or less    Baseline 31.5 sec    Status New    Target Date 01/06/21      PT LONG TERM GOAL #5   Title L hip strength will improve to 4+/5 for improved gait quality abd function    Baseline 4- to 4/5    Status New                    Plan - 11/02/20 0751     Clinical Impression Statement Pt presents to PT with decreasing functional ability related to weakness and pain. Pt reports a 5 year Hx of intermittent L low back, hip and leg pain. Pain is associated with standing after prolonged sitting, when she 1st wakes up in the AM, and with prolonged walking and standing. Today, the pt walked with an antalgic gait pattern over the L LE, the L leg was weaker than the R, and trunk ROMs were decreased minimally in all directions with pain and stiffness, L side bending and rotation were the most painful. Pt does not lose her lordotic curve with forward flexion. Pt was initiated on a HEP c flexion biased exs. Pt will benefit from skilled PT 2x8 for lumbopelvic flexibility and strengthening, and LE strengthening for pain reduction and to optimize functional mobility.    Personal Factors and Comorbidities Age;Fitness;Time since onset of injury/illness/exacerbation;Comorbidity 1;Comorbidity 2;Comorbidity 3+    Comorbidities DM, arthritis, high BMI and HTN    Examination-Activity Limitations Locomotion Level;Stand;Sit    Stability/Clinical Decision Making Stable/Uncomplicated    Clinical Decision Making Low    Rehab Potential Good    PT Frequency 2x / week    PT Duration 8 weeks    PT Treatment/Interventions ADLs/Self Care Home  Management;Aquatic Therapy;Moist Heat;Traction;Therapeutic exercise;Therapeutic activities;Gait training;Stair training;Functional mobility training;Patient/family education;Manual techniques;Passive range of motion;Dry needling;Taping;Spinal Manipulations    PT Next Visit Plan Assess response to HEP and sleeping positions. progress ther ex as indicated    PT Home Exercise Plan [id number]    Consulted and Agree with Plan of Care Patient             Patient will benefit from skilled therapeutic intervention in order to improve the following deficits and impairments:  Abnormal gait, Difficulty walking, Decreased range of motion, Decreased activity tolerance, Pain, Decreased strength, Postural dysfunction  Visit Diagnosis: Muscle weakness (generalized)  Chronic left-sided low back pain with left-sided sciatica  Difficulty in walking, not elsewhere classified  Other abnormalities of gait and mobility     Problem List There are no problems to display for this patient.   Lanisha Stepanian MS, PT 11/02/20 8:53 AM  [facility] Outpatient Rehabilitation [facility] [address] [place], [place], [address] Phone: [phone]   Fax:  [phone]  Name: ADRIE PICKING MRN: [id number] Date of Birth: 07-Aug-1933

## 2020-11-10 DIAGNOSIS — E119 Type 2 diabetes mellitus without complications: Secondary | ICD-10-CM | POA: Diagnosis not present

## 2020-11-10 DIAGNOSIS — M130 Polyarthritis, unspecified: Secondary | ICD-10-CM | POA: Diagnosis not present

## 2020-11-10 DIAGNOSIS — I10 Essential (primary) hypertension: Secondary | ICD-10-CM | POA: Diagnosis not present

## 2020-11-14 ENCOUNTER — Ambulatory Visit: Payer: Medicare Other | Attending: Family Medicine | Admitting: Physical Therapy

## 2020-11-14 ENCOUNTER — Encounter: Payer: Self-pay | Admitting: Physical Therapy

## 2020-11-14 ENCOUNTER — Other Ambulatory Visit: Payer: Self-pay

## 2020-11-14 DIAGNOSIS — M6281 Muscle weakness (generalized): Secondary | ICD-10-CM | POA: Insufficient documentation

## 2020-11-14 DIAGNOSIS — R262 Difficulty in walking, not elsewhere classified: Secondary | ICD-10-CM | POA: Insufficient documentation

## 2020-11-14 DIAGNOSIS — M5442 Lumbago with sciatica, left side: Secondary | ICD-10-CM | POA: Diagnosis not present

## 2020-11-14 DIAGNOSIS — G8929 Other chronic pain: Secondary | ICD-10-CM | POA: Insufficient documentation

## 2020-11-14 DIAGNOSIS — R2689 Other abnormalities of gait and mobility: Secondary | ICD-10-CM | POA: Insufficient documentation

## 2020-11-14 NOTE — Therapy (Signed)
[facility] [address] [place], [place], [address] Phone: [phone]   Fax:  [phone]  Physical Therapy Treatment  Patient Details  Name: Teresa Wu MRN: [id number] Date of Birth: 1933-09-21 Referring Provider (PT): Renaye Rakers, MD   Encounter Date: 11/14/2020   PT End of Session - 11/14/20 1238     Visit Number 2    Number of Visits 17    Date for PT Re-Evaluation 01/06/21    Authorization Type [employer] MEDICARE    PT Start Time 1230    PT Stop Time 1315    PT Time Calculation (min) 45 min             Past Medical History:  Diagnosis Date   Arthritis    Diabetes mellitus    Hyperlipemia    Hypertension    Wears glasses     Past Surgical History:  Procedure Laterality Date   ABDOMINAL HYSTERECTOMY     CARPAL TUNNEL RELEASE  11/05/2011   Procedure: CARPAL TUNNEL RELEASE;  Surgeon: Wyn Forster., MD;  Location: [facility];  Service: Orthopedics;  Laterality: Right;   ULNAR NERVE TRANSPOSITION  11/05/2011   Procedure: ULNAR NERVE DECOMPRESSION/TRANSPOSITION;  Surgeon: Wyn Forster., MD;  Location: [facility];  Service: Orthopedics;  Laterality: Right;  Decompression right ulnar nerve at elbow    There were no vitals filed for this visit.   Subjective Assessment - 11/14/20 1235     Subjective Pt reports pain in lower back with transitions and traveling pain into LLE upon standing.    Currently in Pain? Yes    Pain Score 8    with transitions   Pain Location Back    Pain Orientation Lower    Pain Descriptors / Indicators Aching;Sharp    Pain Type Chronic pain    Pain Radiating Towards LLE    Aggravating Factors  sit-stand, AM , standing and walking    Pain Relieving Factors rest, meds                  [facility] Adult PT Treatment/Exercise - 11/14/20 0001       Lumbar Exercises: Stretches   Single Knee to Chest Stretch 10 seconds;3 reps    Lower Trunk  Rotation 10 seconds    Lower Trunk Rotation Limitations 10 reps    Figure 4 Stretch Limitations figure 4 push and pulll 10 sec x 3 each      Lumbar Exercises: Aerobic   Nustep L3 UE/LE x 5 minutes      Lumbar Exercises: Supine   Pelvic Tilt 15 reps    Bridge 15 reps      Lumbar Exercises: Sidelying   Clam 10 reps    Clam Limitations right and left                       PT Short Term Goals - 11/02/20 0842       PT SHORT TERM GOAL #1   Title Pt will eb Ind in an initial HEP    Baseline Started on eval    Status New    Target Date 11/23/20      PT SHORT TERM GOAL #2   Title Pt will voice understanding of measures to assist with pain reduction    Period Days    Status New    Target Date 11/23/20  PT Long Term Goals - 11/02/20 0844       PT LONG TERM GOAL #1   Title Pt will be Ind in a final HEP to maintain achieved LOF    Status New    Target Date 01/06/21      PT LONG TERM GOAL #2   Title Pt will reports improvement in her pain range with her dailiy activities to 5/10 or less and with 505 decreased frequency    Baseline 0-9/10    Status New    Target Date 01/06/21      PT LONG TERM GOAL #3   Title With reduction of pain, pt will walk with an improved gait pattern, without an antalgic pattern and improved tolerance    Baseline antaligic gait patttern    Status New    Target Date 01/06/21      PT LONG TERM GOAL #4   Title Pt will demonstrate an improved 5xSTS time to reflect improve pain and function to 24 sec or less    Baseline 31.5 sec    Status New    Target Date 01/06/21      PT LONG TERM GOAL #5   Title L hip strength will improve to 4+/5 for improved gait quality abd function    Baseline 4- to 4/5    Status New                   Plan - 11/14/20 1314     Clinical Impression Statement Pt reports compliance with HEP however continued LBP and left leg pain pain with transfers and standing. She also reports  feeling weakness in left ankle. Began Nustep and reviewed HEP. Progressed with additional hip mobility and strengthening. Upon standing at end of session she noted decreased pain. She was given updated HEP.    PT Treatment/Interventions ADLs/Self Care Home Management;Aquatic Therapy;Moist Heat;Traction;Therapeutic exercise;Therapeutic activities;Gait training;Stair training;Functional mobility training;Patient/family education;Manual techniques;Passive range of motion;Dry needling;Taping;Spinal Manipulations    PT Next Visit Plan Assess response to  HEP and sleeping positions. progress ther ex as indicated, a    PT Home Exercise Plan [id number]    Consulted and Agree with Plan of Care Patient             Patient will benefit from skilled therapeutic intervention in order to improve the following deficits and impairments:  Abnormal gait, Difficulty walking, Decreased range of motion, Decreased activity tolerance, Pain, Decreased strength, Postural dysfunction  Visit Diagnosis: Muscle weakness (generalized)  Chronic left-sided low back pain with left-sided sciatica  Other abnormalities of gait and mobility  Difficulty in walking, not elsewhere classified     Problem List There are no problems to display for this patient.   Sherrie Mustache, [place] 11/14/2020, 1:19 PM  [facility] [address] [place], [place], [address] Phone: [phone]   Fax:  [phone]  Name: Teresa Wu MRN: [id number] Date of Birth: 14-May-1933

## 2020-11-16 ENCOUNTER — Ambulatory Visit: Payer: Medicare Other

## 2020-11-16 ENCOUNTER — Other Ambulatory Visit: Payer: Self-pay

## 2020-11-16 DIAGNOSIS — G8929 Other chronic pain: Secondary | ICD-10-CM | POA: Diagnosis not present

## 2020-11-16 DIAGNOSIS — R262 Difficulty in walking, not elsewhere classified: Secondary | ICD-10-CM | POA: Diagnosis not present

## 2020-11-16 DIAGNOSIS — R2689 Other abnormalities of gait and mobility: Secondary | ICD-10-CM | POA: Diagnosis not present

## 2020-11-16 DIAGNOSIS — M6281 Muscle weakness (generalized): Secondary | ICD-10-CM | POA: Diagnosis not present

## 2020-11-16 DIAGNOSIS — M5442 Lumbago with sciatica, left side: Secondary | ICD-10-CM

## 2020-11-16 NOTE — Therapy (Signed)
[facility] [address] [place], [place], [address] Phone: [phone]   Fax:  [phone]  Physical Therapy Treatment  Patient Details  Name: Teresa Wu MRN: [id number] Date of Birth: 1933-05-08 Referring Provider (PT): Renaye Rakers, MD   Encounter Date: 11/16/2020   PT End of Session - 11/16/20 1033     Visit Number 3    Number of Visits 17    Date for PT Re-Evaluation 01/06/21    Authorization Type UHC MEDICARE    PT Start Time 1015    PT Stop Time 1100    PT Time Calculation (min) 45 min    Activity Tolerance Patient tolerated treatment well    Behavior During Therapy [facility] for tasks assessed/performed             Past Medical History:  Diagnosis Date   Arthritis    Diabetes mellitus    Hyperlipemia    Hypertension    Wears glasses     Past Surgical History:  Procedure Laterality Date   ABDOMINAL HYSTERECTOMY     CARPAL TUNNEL RELEASE  11/05/2011   Procedure: CARPAL TUNNEL RELEASE;  Surgeon: Wyn Forster., MD;  Location: [facility];  Service: Orthopedics;  Laterality: Right;   ULNAR NERVE TRANSPOSITION  11/05/2011   Procedure: ULNAR NERVE DECOMPRESSION/TRANSPOSITION;  Surgeon: Wyn Forster., MD;  Location: [facility];  Service: Orthopedics;  Laterality: Right;  Decompression right ulnar nerve at elbow    There were no vitals filed for this visit.   Subjective Assessment - 11/16/20 1022     Subjective Pt reports her low back seems some better.    Patient Stated Goals To walk better with less pain    Currently in Pain? Yes    Pain Score 5    high range is 8/10   Pain Location Back    Pain Orientation Left;Posterior;Lower    Pain Descriptors / Indicators Aching;Sharp    Pain Type Chronic pain    Pain Radiating Towards L LE    Pain Onset More than a month ago    Pain Frequency Intermittent    Aggravating Factors  sit-stand, AM , standing and walking    Pain  Relieving Factors rest, meds                               [facility] Adult PT Treatment/Exercise - 11/16/20 0001       Lumbar Exercises: Stretches   Single Knee to Chest Stretch 2 reps;20 seconds    Lower Trunk Rotation --   5 sec   Lower Trunk Rotation Limitations 10 reps    Piriformis Stretch Right;Left;2 reps;20 seconds    Figure 4 Stretch Limitations figure 4 push 20 sec x 2 each    Other Lumbar Stretch Exercise Forward and lateral trunk flexion, 3x forward and R lateral, 20 sec. L lateral flexion aggrevated L low back pain.      Lumbar Exercises: Aerobic   Nustep L5 UE/LE x 5 minutes      Lumbar Exercises: Supine   Pelvic Tilt 15 reps    Clam 15 reps;3 seconds    Clam Limitations green Tband    Bridge 15 reps    Straight Leg Raise 15 reps    Straight Leg Raises Limitations quad set prior to SLR  PT Education - 11/16/20 1120     Education Details HEP updated for lumbopelvic strengthening exs    Person(s) Educated Patient    Methods Explanation;Demonstration;Tactile cues;Verbal cues;Handout    Comprehension Verbalized understanding;Returned demonstration;Verbal cues required;Tactile cues required              PT Short Term Goals - 11/02/20 0842       PT SHORT TERM GOAL #1   Title Pt will eb Ind in an initial HEP    Baseline Started on eval    Status New    Target Date 11/23/20      PT SHORT TERM GOAL #2   Title Pt will voice understanding of measures to assist with pain reduction    Period Days    Status New    Target Date 11/23/20               PT Long Term Goals - 11/02/20 0844       PT LONG TERM GOAL #1   Title Pt will be Ind in a final HEP to maintain achieved LOF    Status New    Target Date 01/06/21      PT LONG TERM GOAL #2   Title Pt will reports improvement in her pain range with her dailiy activities to 5/10 or less and with 505 decreased frequency    Baseline 0-9/10    Status New     Target Date 01/06/21      PT LONG TERM GOAL #3   Title With reduction of pain, pt will walk with an improved gait pattern, without an antalgic pattern and improved tolerance    Baseline antaligic gait patttern    Status New    Target Date 01/06/21      PT LONG TERM GOAL #4   Title Pt will demonstrate an improved 5xSTS time to reflect improve pain and function to 24 sec or less    Baseline 31.5 sec    Status New    Target Date 01/06/21      PT LONG TERM GOAL #5   Title L hip strength will improve to 4+/5 for improved gait quality abd function    Baseline 4- to 4/5    Status New                   Plan - 11/16/20 1038     Clinical Impression Statement PT was completed today for lumbopelvic flexibility and strengthening exs. Forward trunk flexion and R side bending were completed c good tolerance, while L side bending agggrevated the pt's L low back. Addtionally, strengthening exs were completed to promote improved low back and hip stability. With bridging and and supine Tband clams, decreased hip stability was noted. new exs were added to pt's HEP. Pt reports consistent completion of HEP. After the PT session, pt reported a decrease in L low back pain to 2/10 from 5/10.    Personal Factors and Comorbidities Age;Fitness;Time since onset of injury/illness/exacerbation;Comorbidity 1;Comorbidity 2;Comorbidity 3+    Comorbidities DM, arthritis, high BMI and HTN    Examination-Activity Limitations Locomotion Level;Stand;Sit    Stability/Clinical Decision Making Stable/Uncomplicated    Clinical Decision Making Low    Rehab Potential Good    PT Frequency 2x / week    PT Duration 8 weeks    PT Treatment/Interventions ADLs/Self Care Home Management;Aquatic Therapy;Moist Heat;Traction;Therapeutic exercise;Therapeutic activities;Gait training;Stair training;Functional mobility training;Patient/family education;Manual techniques;Passive range of motion;Dry needling;Taping;Spinal  Manipulations    PT Next Visit  Plan Assess response to  HEP and sleeping positions. progress ther ex as indicated, a    PT Home Exercise Plan [email or web address]    Consulted and Agree with Plan of Care Patient             Patient will benefit from skilled therapeutic intervention in order to improve the following deficits and impairments:  Abnormal gait, Difficulty walking, Decreased range of motion, Decreased activity tolerance, Pain, Decreased strength, Postural dysfunction  Visit Diagnosis: Muscle weakness (generalized)  Chronic left-sided low back pain with left-sided sciatica  Other abnormalities of gait and mobility  Difficulty in walking, not elsewhere classified     Problem List There are no problems to display for this patient.   Joellyn Rued MS, PT 11/16/20 11:30 AM   [facility] [address] [place], [place], [address] Phone: [phone]   Fax:  [phone]  Name: Teresa Wu MRN: [id number] Date of Birth: 07/23/1933

## 2020-11-21 ENCOUNTER — Other Ambulatory Visit: Payer: Self-pay

## 2020-11-21 ENCOUNTER — Ambulatory Visit: Payer: Medicare Other

## 2020-11-21 DIAGNOSIS — M6281 Muscle weakness (generalized): Secondary | ICD-10-CM | POA: Diagnosis not present

## 2020-11-21 DIAGNOSIS — R262 Difficulty in walking, not elsewhere classified: Secondary | ICD-10-CM

## 2020-11-21 DIAGNOSIS — G8929 Other chronic pain: Secondary | ICD-10-CM | POA: Diagnosis not present

## 2020-11-21 DIAGNOSIS — R2689 Other abnormalities of gait and mobility: Secondary | ICD-10-CM | POA: Diagnosis not present

## 2020-11-21 DIAGNOSIS — M5442 Lumbago with sciatica, left side: Secondary | ICD-10-CM | POA: Diagnosis not present

## 2020-11-21 NOTE — Therapy (Signed)
[facility] [address] [place], [place], [address] Phone: [phone]   Fax:  [phone]  Physical Therapy Treatment  Patient Details  Name: Teresa Wu MRN: [id number] Date of Birth: 1933-05-03 Referring Provider (PT): Renaye Rakers, MD   Encounter Date: 11/21/2020   PT End of Session - 11/21/20 1033     Visit Number 4    Number of Visits 17    Date for PT Re-Evaluation 01/06/21    Authorization Type [employer]HC MEDICARE    PT Start Time 1023    PT Stop Time 1104    PT Time Calculation (min) 41 min    Activity Tolerance Patient tolerated treatment well    Behavior During Therapy [facility] for tasks assessed/performed             Past Medical History:  Diagnosis Date   Arthritis    Diabetes mellitus    Hyperlipemia    Hypertension    Wears glasses     Past Surgical History:  Procedure Laterality Date   ABDOMINAL HYSTERECTOMY     CARPAL TUNNEL RELEASE  11/05/2011   Procedure: CARPAL TUNNEL RELEASE;  Surgeon: Wyn Forster., MD;  Location: [facility];  Service: Orthopedics;  Laterality: Right;   ULNAR NERVE TRANSPOSITION  11/05/2011   Procedure: ULNAR NERVE DECOMPRESSION/TRANSPOSITION;  Surgeon: Wyn Forster., MD;  Location: [facility];  Service: Orthopedics;  Laterality: Right;  Decompression right ulnar nerve at elbow    There were no vitals filed for this visit.   Subjective Assessment - 11/21/20 1035     Subjective Pt continues to report her low back and L LE pain seems better.    Patient Stated Goals To walk better with less pain    Currently in Pain? No/denies    Pain Score 0-No pain   7/10 c sitting or sit to/from standing   Pain Location Back    Pain Orientation Left;Posterior    Pain Descriptors / Indicators Aching;Sharp    Pain Type Chronic pain    Pain Onset More than a month ago    Pain Frequency Intermittent    Aggravating Factors  sit-stand, AM , standing and  walking    Pain Relieving Factors rest, meds                               [facility] Adult PT Treatment/Exercise - 11/21/20 0001       Ambulation/Gait   Gait Comments Pt walked in PT today with equal step length and without an antalgic gait pattern      Exercises   Exercises Lumbar      Lumbar Exercises: Stretches   Single Knee to Chest Stretch 2 reps;20 seconds    Lower Trunk Rotation 5 reps   5"   Piriformis Stretch Right;Left;2 reps;20 seconds    Other Lumbar Stretch Exercise Seated trunk forward flexion 2x, 15 sec      Lumbar Exercises: Aerobic   Nustep L5 UE/LE x 5 minutes      Lumbar Exercises: Seated   Sit to Stand 10 reps    Sit to Stand Limitations hinged hip      Lumbar Exercises: Supine   Pelvic Tilt 15 reps    Clam 15 reps;3 seconds    Clam Limitations green Tband    Bridge 15 reps  PT Education - 11/21/20 1503     Education Details Education for warmup exs to complete after prolonged sitting or lying to assist in minimizing back and L LE pain    Person(s) Educated Patient    Methods Explanation;Demonstration;Tactile cues;Verbal cues    Comprehension Verbalized understanding;Returned demonstration;Verbal cues required;Tactile cues required;Need further instruction              PT Short Term Goals - 11/02/20 0842       PT SHORT TERM GOAL #1   Title Pt will eb Ind in an initial HEP    Baseline Started on eval    Status New    Target Date 11/23/20      PT SHORT TERM GOAL #2   Title Pt will voice understanding of measures to assist with pain reduction    Period Days    Status New    Target Date 11/23/20               PT Long Term Goals - 11/02/20 0844       PT LONG TERM GOAL #1   Title Pt will be Ind in a final HEP to maintain achieved LOF    Status New    Target Date 01/06/21      PT LONG TERM GOAL #2   Title Pt will reports improvement in her pain range with her dailiy activities to  5/10 or less and with 505 decreased frequency    Baseline 0-9/10    Status New    Target Date 01/06/21      PT LONG TERM GOAL #3   Title With reduction of pain, pt will walk with an improved gait pattern, without an antalgic pattern and improved tolerance    Baseline antaligic gait patttern    Status New    Target Date 01/06/21      PT LONG TERM GOAL #4   Title Pt will demonstrate an improved 5xSTS time to reflect improve pain and function to 24 sec or less    Baseline 31.5 sec    Status New    Target Date 01/06/21      PT LONG TERM GOAL #5   Title L hip strength will improve to 4+/5 for improved gait quality abd function    Baseline 4- to 4/5    Status New                   Plan - 11/21/20 1033     Clinical Impression Statement Provided education to pt to complete warm up exs prior to standing after prolonged sitting or lying down. Pt practiced completing both marching and a seated trunk stretch in sitting, and wth standing she did not experience low back or L LE pain . PT ther ex was also progressed for lumbopelvic flexibity and strengthening exs to improve function and pain. Following PT today, pt reported no increase in her low back or L LE pain. With ambulation today, it was observed the pt walked with an improved gait pattern, walking without an antalgic gait pattern over the L LE.    Personal Factors and Comorbidities Age;Fitness;Time since onset of injury/illness/exacerbation;Comorbidity 1;Comorbidity 2;Comorbidity 3+    Comorbidities DM, arthritis, high BMI and HTN    Examination-Activity Limitations Locomotion Level;Stand;Sit    Stability/Clinical Decision Making Stable/Uncomplicated    Clinical Decision Making Low    Rehab Potential Good    PT Frequency 2x / week    PT Duration 8 weeks  PT Treatment/Interventions ADLs/Self Care Home Management;Aquatic Therapy;Moist Heat;Traction;Therapeutic exercise;Therapeutic activities;Gait training;Stair  training;Functional mobility training;Patient/family education;Manual techniques;Passive range of motion;Dry needling;Taping;Spinal Manipulations    PT Next Visit Plan Progress ther ex as indicated    PT Home Exercise Plan [email or web address]    Consulted and Agree with Plan of Care Patient             Patient will benefit from skilled therapeutic intervention in order to improve the following deficits and impairments:  Abnormal gait, Difficulty walking, Decreased range of motion, Decreased activity tolerance, Pain, Decreased strength, Postural dysfunction  Visit Diagnosis: Muscle weakness (generalized)  Chronic left-sided low back pain with left-sided sciatica  Difficulty in walking, not elsewhere classified  Other abnormalities of gait and mobility     Problem List There are no problems to display for this patient.   Joellyn Rued MS, PT 11/21/20 3:25 PM   [facility] [address] [place], [place], [address] Phone: [phone]   Fax:  [phone]  Name: Teresa Wu MRN: [id number] Date of Birth: 1933-04-17

## 2020-11-23 ENCOUNTER — Ambulatory Visit: Payer: Medicare Other

## 2020-11-23 ENCOUNTER — Other Ambulatory Visit: Payer: Self-pay

## 2020-11-23 DIAGNOSIS — M5442 Lumbago with sciatica, left side: Secondary | ICD-10-CM

## 2020-11-23 DIAGNOSIS — M6281 Muscle weakness (generalized): Secondary | ICD-10-CM | POA: Diagnosis not present

## 2020-11-23 DIAGNOSIS — R262 Difficulty in walking, not elsewhere classified: Secondary | ICD-10-CM | POA: Diagnosis not present

## 2020-11-23 DIAGNOSIS — R2689 Other abnormalities of gait and mobility: Secondary | ICD-10-CM | POA: Diagnosis not present

## 2020-11-23 DIAGNOSIS — G8929 Other chronic pain: Secondary | ICD-10-CM | POA: Diagnosis not present

## 2020-11-23 NOTE — Therapy (Signed)
[facility] [address] [place], [place], [address] Phone: [phone]   Fax:  [phone]  Physical Therapy Treatment  Patient Details  Name: Teresa Wu MRN: [id number] Date of Birth: 1933/10/16 Referring Provider (PT): Renaye Rakers, MD   Encounter Date: 11/23/2020   PT End of Session - 11/23/20 1059     Visit Number 5    Number of Visits 17    Date for PT Re-Evaluation 01/06/21    Authorization Type UHC MEDICARE    PT Start Time 1025    PT Stop Time 1108    PT Time Calculation (min) 43 min    Activity Tolerance Patient tolerated treatment well    Behavior During Therapy [facility] for tasks assessed/performed             Past Medical History:  Diagnosis Date   Arthritis    Diabetes mellitus    Hyperlipemia    Hypertension    Wears glasses     Past Surgical History:  Procedure Laterality Date   ABDOMINAL HYSTERECTOMY     CARPAL TUNNEL RELEASE  11/05/2011   Procedure: CARPAL TUNNEL RELEASE;  Surgeon: Wyn Forster., MD;  Location: [facility];  Service: Orthopedics;  Laterality: Right;   ULNAR NERVE TRANSPOSITION  11/05/2011   Procedure: ULNAR NERVE DECOMPRESSION/TRANSPOSITION;  Surgeon: Wyn Forster., MD;  Location: [facility];  Service: Orthopedics;  Laterality: Right;  Decompression right ulnar nerve at elbow    There were no vitals filed for this visit.   Subjective Assessment - 11/23/20 1047     Subjective I'm doing better. My back has not been bothering me as much.    Patient Stated Goals To walk better with less pain    Currently in Pain? No/denies    Pain Score 0-No pain    Pain Location Back    Pain Orientation Left;Posterior    Pain Descriptors / Indicators Aching    Pain Type Chronic pain    Pain Onset More than a month ago    Pain Frequency Intermittent                               [facility] Adult PT Treatment/Exercise - 11/23/20 0001        Ambulation/Gait   Gait Comments Pt walked during the PT session without and antalgic gait pattern over the L LE.      Lumbar Exercises: Stretches   Lower Trunk Rotation 5 reps   5"   Other Lumbar Stretch Exercise Seated trunk forward flexion 3x, 15 sec      Lumbar Exercises: Standing   Row 15 reps    Theraband Level (Row) Level 2 (Red)    Shoulder Extension 15 reps    Theraband Level (Shoulder Extension) Level 2 (Red)    Other Standing Lumbar Exercises Pallof press, double red theraband, 10x, each direction    Other Standing Lumbar Exercises Pallof side steps, single red theraband, 5 steps, 5x      Lumbar Exercises: Seated   Sit to Stand 10 reps    Sit to Stand Limitations hinged hip    Other Seated Lumbar Exercises Marching 10x each LE      Lumbar Exercises: Supine   Pelvic Tilt 15 reps    Clam 15 reps;3 seconds   2 sets   Clam Limitations green Tband    Bent Knee Raise 5 reps;5 seconds  2 sets   Bent Knee Raise Limitations abdominal bracing, LEs                     PT Education - 11/23/20 1157     Education Details Reviewed HEP and the principle of warm up activity after prolonged sitting or lying down prior to standing to minimize aggrevating her low back.    Person(s) Educated Patient    Methods Explanation;Demonstration    Comprehension Verbalized understanding;Returned demonstration              PT Short Term Goals - 11/23/20 1146       PT SHORT TERM GOAL #1   Title Pt will be Ind in an initial HEP    Status Achieved    Target Date 11/23/20      PT SHORT TERM GOAL #2   Title Pt will voice understanding of measures to assist with pain reduction. 11/23/20- Pt reports her exs have been helpful, warming up prior to standing from sitting or lying down for a while. She also notes hot showers help her back    Status Achieved    Target Date 11/23/20               PT Long Term Goals - 11/02/20 0844       PT LONG TERM GOAL #1   Title  Pt will be Ind in a final HEP to maintain achieved LOF    Status New    Target Date 01/06/21      PT LONG TERM GOAL #2   Title Pt will reports improvement in her pain range with her dailiy activities to 5/10 or less and with 505 decreased frequency    Baseline 0-9/10    Status New    Target Date 01/06/21      PT LONG TERM GOAL #3   Title With reduction of pain, pt will walk with an improved gait pattern, without an antalgic pattern and improved tolerance    Baseline antaligic gait patttern    Status New    Target Date 01/06/21      PT LONG TERM GOAL #4   Title Pt will demonstrate an improved 5xSTS time to reflect improve pain and function to 24 sec or less    Baseline 31.5 sec    Status New    Target Date 01/06/21      PT LONG TERM GOAL #5   Title L hip strength will improve to 4+/5 for improved gait quality abd function    Baseline 4- to 4/5    Status New                   Plan - 11/23/20 1158     Clinical Impression Statement Pt's subjective report of decreased pain and her improved walking without an antalgic gait pattern correlate to indicate improvement of her condition. The PT session was completed today to progress lumbopelvic strengthening/stability. No changes were made to the pt's HEP. Pt does demonstrate good understanding of her current HEP. P tolerated today's PT session wihtout adverse effects. Pt will continue to benefit from skilled PT to address mobility and strength deficits to improve her pain and LOF.    Personal Factors and Comorbidities Age;Fitness;Time since onset of injury/illness/exacerbation;Comorbidity 1;Comorbidity 2;Comorbidity 3+    Comorbidities DM, arthritis, high BMI and HTN    Examination-Activity Limitations Locomotion Level;Stand;Sit    Stability/Clinical Decision Making Stable/Uncomplicated    Clinical Decision Making Low  Rehab Potential Good    PT Frequency 2x / week    PT Duration 8 weeks    PT Treatment/Interventions  ADLs/Self Care Home Management;Aquatic Therapy;Moist Heat;Traction;Therapeutic exercise;Therapeutic activities;Gait training;Stair training;Functional mobility training;Patient/family education;Manual techniques;Passive range of motion;Dry needling;Taping;Spinal Manipulations    PT Next Visit Plan Progress ther ex as indicated. If pt continues to do well, then next week will assess the appropriateness of decreasing PT frequency.    PT Home Exercise Plan [id number]    Consulted and Agree with Plan of Care Patient             Patient will benefit from skilled therapeutic intervention in order to improve the following deficits and impairments:  Abnormal gait, Difficulty walking, Decreased range of motion, Decreased activity tolerance, Pain, Decreased strength, Postural dysfunction  Visit Diagnosis: Muscle weakness (generalized)  Chronic left-sided low back pain with left-sided sciatica  Difficulty in walking, not elsewhere classified  Other abnormalities of gait and mobility     Problem List There are no problems to display for this patient.   Joellyn Rued MS, PT 11/23/20 12:11 PM   [facility] [address] [place], [place], [address] Phone: [phone]   Fax:  [phone]  Name: Teresa Wu MRN: [id number] Date of Birth: 1933-11-13

## 2020-11-28 ENCOUNTER — Encounter: Payer: Self-pay | Admitting: Physical Therapy

## 2020-11-28 ENCOUNTER — Ambulatory Visit: Payer: Medicare Other | Admitting: Physical Therapy

## 2020-11-28 ENCOUNTER — Other Ambulatory Visit: Payer: Self-pay

## 2020-11-28 DIAGNOSIS — M6281 Muscle weakness (generalized): Secondary | ICD-10-CM | POA: Diagnosis not present

## 2020-11-28 DIAGNOSIS — G8929 Other chronic pain: Secondary | ICD-10-CM | POA: Diagnosis not present

## 2020-11-28 DIAGNOSIS — R262 Difficulty in walking, not elsewhere classified: Secondary | ICD-10-CM

## 2020-11-28 DIAGNOSIS — R2689 Other abnormalities of gait and mobility: Secondary | ICD-10-CM | POA: Diagnosis not present

## 2020-11-28 DIAGNOSIS — M5442 Lumbago with sciatica, left side: Secondary | ICD-10-CM

## 2020-11-28 NOTE — Therapy (Signed)
[facility] [address] [place], [place], [address] Phone: [phone]   Fax:  [phone]  Physical Therapy Treatment  Patient Details  Name: Teresa Wu MRN: [id number] Date of Birth: 1933-03-16 Referring Provider (PT): Renaye Rakers, MD   Encounter Date: 11/28/2020   PT End of Session - 11/28/20 1131     Visit Number 6    Number of Visits 17    Date for PT Re-Evaluation 01/06/21    Authorization Type UHC MEDICARE    PT Start Time 1015    PT Stop Time 1105    PT Time Calculation (min) 50 min             Past Medical History:  Diagnosis Date   Arthritis    Diabetes mellitus    Hyperlipemia    Hypertension    Wears glasses     Past Surgical History:  Procedure Laterality Date   ABDOMINAL HYSTERECTOMY     CARPAL TUNNEL RELEASE  11/05/2011   Procedure: CARPAL TUNNEL RELEASE;  Surgeon: Wyn Forster., MD;  Location: [facility];  Service: Orthopedics;  Laterality: Right;   ULNAR NERVE TRANSPOSITION  11/05/2011   Procedure: ULNAR NERVE DECOMPRESSION/TRANSPOSITION;  Surgeon: Wyn Forster., MD;  Location: [facility];  Service: Orthopedics;  Laterality: Right;  Decompression right ulnar nerve at elbow    There were no vitals filed for this visit.   Subjective Assessment - 11/28/20 1020     Subjective Right ankle is hurting , making me limp a little. Everything is else no pain. I feel back pain in the mornings that gets better as I get moving.    Currently in Pain? Yes    Pain Score 8     Pain Location Ankle    Pain Orientation Right    Pain Descriptors / Indicators Aching;Sharp   when walking   Pain Type Acute pain    Aggravating Factors  standing, walking    Pain Relieving Factors rest                               [facility] Adult PT Treatment/Exercise - 11/28/20 0001       Lumbar Exercises: Stretches   Single Knee to Chest Stretch 2 reps;20  seconds      Lumbar Exercises: Aerobic   Nustep L5 UE/LE x 5 minutes      Lumbar Exercises: Seated   Other Seated Lumbar Exercises seated ankle heel and toe raises, gastroc stretch , ankel circles - right      Lumbar Exercises: Supine   Pelvic Tilt 15 reps    Clam 15 reps;3 seconds   2 sets   Clam Limitations green Tband    Bent Knee Raise 5 reps;5 seconds   2 sets   Bent Knee Raise Limitations abdominal bracing, LEs    Bridge 15 reps      Modalities   Modalities Cryotherapy      Cryotherapy   Cryotherapy Location Ankle    Type of Cryotherapy Ice pack                       PT Short Term Goals - 11/23/20 1146       PT SHORT TERM GOAL #1   Title Pt will be Ind in an initial HEP    Status Achieved    Target Date 11/23/20  PT SHORT TERM GOAL #2   Title Pt will voice understanding of measures to assist with pain reduction. 11/23/20- Pt reports her exs have been helpful, warming up prior to standing from sitting or lying down for a while. She also notes hot showers help her back    Status Achieved    Target Date 11/23/20               PT Long Term Goals - 11/02/20 0844       PT LONG TERM GOAL #1   Title Pt will be Ind in a final HEP to maintain achieved LOF    Status New    Target Date 01/06/21      PT LONG TERM GOAL #2   Title Pt will reports improvement in her pain range with her dailiy activities to 5/10 or less and with 505 decreased frequency    Baseline 0-9/10    Status New    Target Date 01/06/21      PT LONG TERM GOAL #3   Title With reduction of pain, pt will walk with an improved gait pattern, without an antalgic pattern and improved tolerance    Baseline antaligic gait patttern    Status New    Target Date 01/06/21      PT LONG TERM GOAL #4   Title Pt will demonstrate an improved 5xSTS time to reflect improve pain and function to 24 sec or less    Baseline 31.5 sec    Status New    Target Date 01/06/21      PT LONG TERM GOAL  #5   Title L hip strength will improve to 4+/5 for improved gait quality abd function    Baseline 4- to 4/5    Status New                   Plan - 11/28/20 1039     Clinical Impression Statement Pt arrives reporting acute right ankle pain 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 She demonstrates antalgig gait pattern over RLE.  Continued with seated and supine therex due to pt antalgic gait pattern today. Instructed pt in right ankle ROM and seated right calf stretch. Visual decrease in right DF ROM. ICe applied to right ankle at end of session for education on treatment of acute pain. She reports the ankle felt better after session. She was advised to ice 2 x per day. She was agreeable to decreasing frequency to 1 x per week.    PT Treatment/Interventions ADLs/Self Care Home Management;Aquatic Therapy;Moist Heat;Traction;Therapeutic exercise;Therapeutic activities;Gait training;Stair training;Functional mobility training;Patient/family education;Manual techniques;Passive range of motion;Dry needling;Taping;Spinal Manipulations    PT Next Visit Plan Progress ther ex as indicated. How is right ankle? FOTO    PT Home Exercise Plan [id number]             Patient will benefit from skilled therapeutic intervention in order to improve the following deficits and impairments:  Abnormal gait, Difficulty walking, Decreased range of motion, Decreased activity tolerance, Pain, Decreased strength, Postural dysfunction  Visit Diagnosis: Muscle weakness (generalized)  Chronic left-sided low back pain with left-sided sciatica  Difficulty in walking, not elsewhere classified  Other abnormalities of gait and mobility     Problem List There are no problems to display for this patient.   Sherrie Mustache, [place] 11/28/2020, 11:36 AM  [facility] [address] [place], [place], [address] Phone: [phone]    Fax:  [phone]  Name: Teresa Wu MRN: [id number] Date of Birth: Jul 04, 1933

## 2020-11-30 ENCOUNTER — Ambulatory Visit: Payer: Medicare Other | Admitting: Physical Therapy

## 2020-12-05 ENCOUNTER — Ambulatory Visit: Payer: Medicare Other | Admitting: Physical Therapy

## 2020-12-05 ENCOUNTER — Other Ambulatory Visit: Payer: Self-pay

## 2020-12-05 ENCOUNTER — Encounter: Payer: Self-pay | Admitting: Physical Therapy

## 2020-12-05 DIAGNOSIS — R2689 Other abnormalities of gait and mobility: Secondary | ICD-10-CM | POA: Diagnosis not present

## 2020-12-05 DIAGNOSIS — R262 Difficulty in walking, not elsewhere classified: Secondary | ICD-10-CM

## 2020-12-05 DIAGNOSIS — G8929 Other chronic pain: Secondary | ICD-10-CM | POA: Diagnosis not present

## 2020-12-05 DIAGNOSIS — M6281 Muscle weakness (generalized): Secondary | ICD-10-CM

## 2020-12-05 DIAGNOSIS — M5442 Lumbago with sciatica, left side: Secondary | ICD-10-CM | POA: Diagnosis not present

## 2020-12-05 NOTE — Therapy (Signed)
[place], [place], [address] Phone: [phone]   Fax:  [phone]  Physical Therapy Treatment  Patient Details  Name: Teresa Wu MRN: [id number] Date of Birth: 1934/01/03 Referring Provider (PT): Lucianne Lei, MD   Encounter Date: 12/05/2020   PT End of Session - 12/05/20 1020     Visit Number 7    Number of Visits 17    Date for PT Re-Evaluation 01/06/21    Authorization Type UHC MEDICARE    PT Start Time 1015    PT Stop Time 1056    PT Time Calculation (min) 41 min             Past Medical History:  Diagnosis Date   Arthritis    Diabetes mellitus    Hyperlipemia    Hypertension    Wears glasses     Past Surgical History:  Procedure Laterality Date   ABDOMINAL HYSTERECTOMY     CARPAL TUNNEL RELEASE  11/05/2011   Procedure: CARPAL TUNNEL RELEASE;  Surgeon: Cammie Sickle., MD;  Location: [place];  Service: Orthopedics;  Laterality: Right;   ULNAR NERVE TRANSPOSITION  11/05/2011   Procedure: ULNAR NERVE DECOMPRESSION/TRANSPOSITION;  Surgeon: Cammie Sickle., MD;  Location: [place];  Service: Orthopedics;  Laterality: Right;  Decompression right ulnar nerve at elbow    There were no vitals filed for this visit.   Subjective Assessment - 12/05/20 1017     Subjective No pain since last Friday. I was very active cooking dinner for my family. The pain was less intense up to 3-4/10 and did not come down the leg.    Currently in Pain? Yes    Pain Score 3     Pain Location Back    Pain Orientation Left;Mid    Pain Descriptors / Indicators Aching    Aggravating Factors  cooking prolonged, activity on feet prolonged.    Pain Relieving Factors rest                [facility] PT Assessment - 12/05/20 0001       Observation/Other Assessments   Focus on Therapeutic Outcomes (FOTO)  56% improved from 49% (55% predicted)      Strength   Right  Hip Flexion 4+/5    Left Hip Flexion 4+/5                 [facility] Adult PT Treatment/Exercise - 12/05/20 0001       Lumbar Exercises: Stretches   Single Knee to Chest Stretch 2 reps;20 seconds    Lower Trunk Rotation 5 reps   5"     Lumbar Exercises: Aerobic   Nustep L5 UE/LE x 5 minutes      Lumbar Exercises: Standing   Row 15 reps    Theraband Level (Row) Level 3 (Green)    Shoulder Extension 15 reps    Theraband Level (Shoulder Extension) Level 2 (Red)    Other Standing Lumbar Exercises Pallof press, double red theraband, 10x, each direction    Other Standing Lumbar Exercises standing hip abduction x 10 each at counter      Lumbar Exercises: Seated   Sit to Stand 10 reps    Sit to Stand Limitations from bariatric low chair      Lumbar Exercises: Supine   Clam 20 reps    Clam Limitations blue band    Bent Knee Raise 20 reps    Bent  Knee Raise Limitations blue band , abdominal brace    Bridge 15 reps                       PT Short Term Goals - 11/23/20 1146       PT SHORT TERM GOAL #1   Title Pt will be Ind in an initial HEP    Status Achieved    Target Date 11/23/20      PT SHORT TERM GOAL #2   Title Pt will voice understanding of measures to assist with pain reduction. 11/23/20- Pt reports her exs have been helpful, warming up prior to standing from sitting or lying down for a while. She also notes hot showers help her back    Status Achieved    Target Date 11/23/20               PT Long Term Goals - 12/05/20 1147       PT LONG TERM GOAL #1   Title Pt will be Ind in a final HEP to maintain achieved LOF    Baseline independent with current HEP    Period Weeks    Status On-going      PT LONG TERM GOAL #2   Title Pt will reports improvement in her pain range with her dailiy activities to 5/10 or less and with 505 decreased frequency    Baseline Pain 4-5/10 in last week    Period Weeks    Status Partially Met      PT LONG TERM  GOAL #3   Title With reduction of pain, pt will walk with an improved gait pattern, without an antalgic pattern and improved tolerance    Baseline much improved    Period Weeks    Status Achieved      PT LONG TERM GOAL #4   Title Pt will demonstrate an improved 5xSTS time to reflect improve pain and function to 24 sec or less    Baseline 31.5 sec    Period Weeks    Status Unable to assess      PT LONG TERM GOAL #5   Title L hip strength will improve to 4+/5 for improved gait quality abd function    Baseline improving, see flowsheet    Period Weeks    Status On-going                   Plan - 12/05/20 1154     Clinical Impression Statement FOTO score improved. Hip strength improving. She demonstrates improved gait pattern. No pain on arrival and no radicular symptoms in over a week. She does have increased low back pain with cooking and prolonged activity on her feet. Continued with standing core and hip strength with good tolerance. She is making good progress with LTGS. She has met LTG# 3 and partially met LTG #2.    PT Treatment/Interventions ADLs/Self Care Home Management;Aquatic Therapy;Moist Heat;Traction;Therapeutic exercise;Therapeutic activities;Gait training;Stair training;Functional mobility training;Patient/family education;Manual techniques;Passive range of motion;Dry needling;Taping;Spinal Manipulations    PT Next Visit Plan Progress ther ex as indicated.             Patient will benefit from skilled therapeutic intervention in order to improve the following deficits and impairments:  Abnormal gait, Difficulty walking, Decreased range of motion, Decreased activity tolerance, Pain, Decreased strength, Postural dysfunction  Visit Diagnosis: Muscle weakness (generalized)  Chronic left-sided low back pain with left-sided sciatica  Difficulty in walking, not elsewhere classified  Other abnormalities  of gait and mobility     Problem List There are no  problems to display for this patient.   Teresa Wu, [place] 12/05/2020, 11:56 AM  [facility] [address] [place], [place], [address] Phone: [phone]   Fax:  [phone]  Name: Teresa Wu MRN: [id number] Date of Birth: Mar 09, 1933

## 2020-12-07 ENCOUNTER — Encounter: Payer: Medicare Other | Admitting: Physical Therapy

## 2020-12-11 DIAGNOSIS — E785 Hyperlipidemia, unspecified: Secondary | ICD-10-CM | POA: Diagnosis not present

## 2020-12-11 DIAGNOSIS — I10 Essential (primary) hypertension: Secondary | ICD-10-CM | POA: Diagnosis not present

## 2020-12-11 DIAGNOSIS — E1169 Type 2 diabetes mellitus with other specified complication: Secondary | ICD-10-CM | POA: Diagnosis not present

## 2020-12-12 ENCOUNTER — Ambulatory Visit: Payer: Medicare Other | Attending: Family Medicine

## 2020-12-12 ENCOUNTER — Other Ambulatory Visit: Payer: Self-pay

## 2020-12-12 DIAGNOSIS — R262 Difficulty in walking, not elsewhere classified: Secondary | ICD-10-CM | POA: Insufficient documentation

## 2020-12-12 DIAGNOSIS — M5442 Lumbago with sciatica, left side: Secondary | ICD-10-CM | POA: Insufficient documentation

## 2020-12-12 DIAGNOSIS — M6281 Muscle weakness (generalized): Secondary | ICD-10-CM | POA: Diagnosis not present

## 2020-12-12 DIAGNOSIS — G8929 Other chronic pain: Secondary | ICD-10-CM | POA: Diagnosis not present

## 2020-12-12 DIAGNOSIS — R2689 Other abnormalities of gait and mobility: Secondary | ICD-10-CM | POA: Insufficient documentation

## 2020-12-12 NOTE — Therapy (Signed)
[facility] [address] [place], [place], [address] Phone: [phone]   Fax:  [phone]  Physical Therapy Treatment  Patient Details  Name: Teresa Wu MRN: [id number] Date of Birth: 1933/10/06 Referring Provider (PT): Renaye Rakers, MD   Encounter Date: 12/12/2020   PT End of Session - 12/12/20 1020     Visit Number 8    Number of Visits 17    Date for PT Re-Evaluation 01/06/21    Authorization Type UHC MEDICARE    PT Start Time 1020    PT Stop Time 1100    PT Time Calculation (min) 40 min    Activity Tolerance Patient tolerated treatment well    Behavior During Therapy [facility] for tasks assessed/performed             Past Medical History:  Diagnosis Date   Arthritis    Diabetes mellitus    Hyperlipemia    Hypertension    Wears glasses     Past Surgical History:  Procedure Laterality Date   ABDOMINAL HYSTERECTOMY     CARPAL TUNNEL RELEASE  11/05/2011   Procedure: CARPAL TUNNEL RELEASE;  Surgeon: Wyn Forster., MD;  Location: [facility];  Service: Orthopedics;  Laterality: Right;   ULNAR NERVE TRANSPOSITION  11/05/2011   Procedure: ULNAR NERVE DECOMPRESSION/TRANSPOSITION;  Surgeon: Wyn Forster., MD;  Location: [facility];  Service: Orthopedics;  Laterality: Right;  Decompression right ulnar nerve at elbow    There were no vitals filed for this visit.   Subjective Assessment - 12/12/20 1027     Subjective Pt reports she is better. She reports L leg pain has resolved and her low back is less and less often. She reports a tinge of L knee pain early this AM that has resolved.    Patient Stated Goals To walk better with less pain    Pain Score 0-No pain    Pain Location Back    Pain Orientation Left;Mid    Pain Descriptors / Indicators Aching    Pain Type Chronic pain    Pain Onset More than a month ago                [facility] PT Assessment - 12/12/20 0001        Transfers   Five time sit to stand comments  16.9 sec                           [facility] Adult PT Treatment/Exercise - 12/12/20 0001       Lumbar Exercises: Aerobic   Nustep L5 UE/LE x 5 minutes      Lumbar Exercises: Standing   Row 15 reps    Theraband Level (Row) Level 3 (Green)    Shoulder Extension 15 reps    Theraband Level (Shoulder Extension) Level 2 (Red)    Other Standing Lumbar Exercises Pallof press, double red theraband, 10x, each direction. Pallof step steps, single red Tband, 3x5'    Other Standing Lumbar Exercises standing hip abduction 2 x 10 each at counter. f/b SLS 2x30 30 sec L and R      Lumbar Exercises: Seated   Sit to Stand 10 reps    Sit to Stand Limitations from bariatric low chair                       PT Short Term Goals - 11/23/20 1146  PT SHORT TERM GOAL #1   Title Pt will be Ind in an initial HEP    Status Achieved    Target Date 11/23/20      PT SHORT TERM GOAL #2   Title Pt will voice understanding of measures to assist with pain reduction. 11/23/20- Pt reports her exs have been helpful, warming up prior to standing from sitting or lying down for a while. She also notes hot showers help her back    Status Achieved    Target Date 11/23/20               PT Long Term Goals - 12/12/20 2151       PT LONG TERM GOAL #4   Title Pt will demonstrate an improved 5xSTS time to reflect improve pain and function to 24 sec or less. 12/12/20: 16.9 sec    Status Achieved    Target Date 12/12/20                   Plan - 12/12/20 1051     Clinical Impression Statement Pt was completed for lumbopelvic strengthening in standing. Pt demostrated better hip stability with SLS stands needing only light finger tip touches. Pt's improvement is also reflected in significant gains she has made with her 5X STS time which neary decreased by half since the eval. Pt is making appropriate progress with re: improved pain  and function. Will review and update HEP the next PT session.    Personal Factors and Comorbidities Age;Fitness;Time since onset of injury/illness/exacerbation;Comorbidity 1;Comorbidity 2;Comorbidity 3+    Comorbidities DM, arthritis, high BMI and HTN    Examination-Activity Limitations Locomotion Level;Stand;Sit    Stability/Clinical Decision Making Stable/Uncomplicated    Clinical Decision Making Low    Rehab Potential Good    PT Frequency 2x / week    PT Duration 8 weeks    PT Treatment/Interventions ADLs/Self Care Home Management;Aquatic Therapy;Moist Heat;Traction;Therapeutic exercise;Therapeutic activities;Gait training;Stair training;Functional mobility training;Patient/family education;Manual techniques;Passive range of motion;Dry needling;Taping;Spinal Manipulations    PT Next Visit Plan Progress ther ex as indicated. Update HEP.    PT Home Exercise Plan [id number]    Consulted and Agree with Plan of Care Patient             Patient will benefit from skilled therapeutic intervention in order to improve the following deficits and impairments:  Abnormal gait, Difficulty walking, Decreased range of motion, Decreased activity tolerance, Pain, Decreased strength, Postural dysfunction  Visit Diagnosis: Muscle weakness (generalized)  Chronic left-sided low back pain with left-sided sciatica  Difficulty in walking, not elsewhere classified  Other abnormalities of gait and mobility     Problem List There are no problems to display for this patient.   Joellyn Rued MS, PT 12/12/20 10:04 PM   [facility] [address] [place], [place], [address] Phone: [phone]   Fax:  [phone]  Name: Teresa Wu MRN: [id number] Date of Birth: 1933/03/06

## 2020-12-14 ENCOUNTER — Encounter: Payer: Medicare Other | Admitting: Physical Therapy

## 2020-12-19 ENCOUNTER — Other Ambulatory Visit: Payer: Self-pay

## 2020-12-19 ENCOUNTER — Ambulatory Visit: Payer: Medicare Other

## 2020-12-19 DIAGNOSIS — R262 Difficulty in walking, not elsewhere classified: Secondary | ICD-10-CM

## 2020-12-19 DIAGNOSIS — G8929 Other chronic pain: Secondary | ICD-10-CM | POA: Diagnosis not present

## 2020-12-19 DIAGNOSIS — R2689 Other abnormalities of gait and mobility: Secondary | ICD-10-CM

## 2020-12-19 DIAGNOSIS — M5442 Lumbago with sciatica, left side: Secondary | ICD-10-CM | POA: Diagnosis not present

## 2020-12-19 DIAGNOSIS — M6281 Muscle weakness (generalized): Secondary | ICD-10-CM | POA: Diagnosis not present

## 2020-12-19 NOTE — Therapy (Signed)
[place], [place], [address] Phone: [phone]   Fax:  [phone]  Physical Therapy Treatment/Discharge  Patient Details  Name: Teresa Wu MRN: [id number] Date of Birth: Sep 08, 1933 Referring Provider (PT): Lucianne Lei, MD   Encounter Date: 12/19/2020   PT End of Session - 12/19/20 1032     Visit Number 9    Number of Visits 17    Date for PT Re-Evaluation 01/06/21    Authorization Type UHC MEDICARE    PT Start Time 1027    PT Stop Time 1108    PT Time Calculation (min) 41 min    Activity Tolerance Patient tolerated treatment well    Behavior During Therapy [facility] for tasks assessed/performed             Past Medical History:  Diagnosis Date   Arthritis    Diabetes mellitus    Hyperlipemia    Hypertension    Wears glasses     Past Surgical History:  Procedure Laterality Date   ABDOMINAL HYSTERECTOMY     CARPAL TUNNEL RELEASE  11/05/2011   Procedure: CARPAL TUNNEL RELEASE;  Surgeon: Cammie Sickle., MD;  Location: [facility];  Service: Orthopedics;  Laterality: Right;   ULNAR NERVE TRANSPOSITION  11/05/2011   Procedure: ULNAR NERVE DECOMPRESSION/TRANSPOSITION;  Surgeon: Cammie Sickle., MD;  Location: [place];  Service: Orthopedics;  Laterality: Right;  Decompression right ulnar nerve at elbow    There were no vitals filed for this visit.   Subjective Assessment - 12/19/20 2130     Subjective Pt reports she is pleased with her progress with her pain and ability to wal and function much better. She notes she experiences pain most frequently when she getting up from prolonged sitting.    Patient Stated Goals To walk better with less pain    Currently in Pain? No/denies    Pain Score --   Pain range 0-3/10   Pain Orientation Left;Lower    Pain Descriptors / Indicators Aching    Pain Type Chronic pain    Pain Onset More than a month ago    Pain  Frequency Occasional    Aggravating Factors  Standing from prolonged sitting    Pain Relieving Factors HEP, rest                               [facility] Adult PT Treatment/Exercise - 12/19/20 0001       Lumbar Exercises: Stretches   Single Knee to Chest Stretch 2 reps;20 seconds      Lumbar Exercises: Aerobic   Nustep L5 UE/LE x 5 minutes      Lumbar Exercises: Standing   Row 15 reps    Theraband Level (Row) Level 3 (Green)    Shoulder Extension 15 reps    Theraband Level (Shoulder Extension) Level 2 (Red)    Other Standing Lumbar Exercises Pallof press, single GTB, 10x, each direction.      Lumbar Exercises: Seated   Sit to Stand 10 reps    Sit to Stand Limitations from mat table      Lumbar Exercises: Supine   Clam 20 reps    Clam Limitations blue band    Bridge 15 reps                     PT Education - 12/19/20 2140  Education Details Updated for final HEP. Reviewed again the principle of warming up with prolonged sitting or lying down    Person(s) Educated Patient    Methods Explanation    Comprehension Verbalized understanding              PT Short Term Goals - 11/23/20 1146       PT SHORT TERM GOAL #1   Title Pt will be Ind in an initial HEP    Status Achieved    Target Date 11/23/20      PT SHORT TERM GOAL #2   Title Pt will voice understanding of measures to assist with pain reduction. 11/23/20- Pt reports her exs have been helpful, warming up prior to standing from sitting or lying down for a while. She also notes hot showers help her back    Status Achieved    Target Date 11/23/20               PT Long Term Goals - 12/19/20 2141       PT LONG TERM GOAL #1   Title Pt will be Ind in a final HEP to maintain achieved LOF    Status Achieved    Target Date 12/19/20      PT LONG TERM GOAL #2   Title Pt will reports improvement in her pain range with her dailiy activities to 5/10 or less and with 505 decreased  frequency. 12/19/20: 0-3/10, and 70% less frequent    Baseline Pain 4-5/10 in last week    Target Date 12/19/20      PT LONG TERM GOAL #3   Title With reduction of pain, pt will walk with an improved gait pattern, without an antalgic pattern and improved tolerance. 12/19/20: Pt is walking without and antalgic gait pattern.    Status Achieved    Target Date 12/19/20      PT LONG TERM GOAL #4   Title Pt will demonstrate an improved 5xSTS time to reflect improve pain and function to 24 sec or less. 12/12/20: 16.9 sec    Status Achieved    Target Date 12/12/20      PT LONG TERM GOAL #5   Title L hip strength will improve to 4+/5 for improved gait quality abd function. 12/19/20: L hip =4+/5    Status Achieved    Target Date 12/19/20                   Plan - 12/19/20 1056     Clinical Impression Statement Pt has made very good progress in PT with her low back pain and functional mobility. Pt has met all set goals, see below. Pt is Ind in a final HEP to maintain her achieved LOF. Pt is in agreement with DC at this time.    Personal Factors and Comorbidities Age;Fitness;Time since onset of injury/illness/exacerbation;Comorbidity 1;Comorbidity 2;Comorbidity 3+    Comorbidities DM, arthritis, high BMI and HTN    Examination-Activity Limitations Locomotion Level;Stand;Sit    Stability/Clinical Decision Making Stable/Uncomplicated    Clinical Decision Making Low    Rehab Potential Good    PT Frequency 2x / week    PT Duration 8 weeks    PT Treatment/Interventions ADLs/Self Care Home Management;Aquatic Therapy;Moist Heat;Traction;Therapeutic exercise;Therapeutic activities;Gait training;Stair training;Functional mobility training;Patient/family education;Manual techniques;Passive range of motion;Dry needling;Taping;Spinal Manipulations    PT Home Exercise Plan [id number]    Consulted and Agree with Plan of Care Patient  Patient will benefit from skilled therapeutic  intervention in order to improve the following deficits and impairments:  Abnormal gait, Difficulty walking, Decreased range of motion, Decreased activity tolerance, Pain, Decreased strength, Postural dysfunction  Visit Diagnosis: Muscle weakness (generalized)  Chronic left-sided low back pain with left-sided sciatica  Difficulty in walking, not elsewhere classified  Other abnormalities of gait and mobility     Problem List There are no problems to display for this patient. PHYSICAL THERAPY DISCHARGE SUMMARY  Visits from Start of Care: 9  Current functional level related to goals / functional outcomes: See above   Remaining deficits:  See above   Education / Equipment: HEP  Patient agrees to discharge. Patient goals were met. Patient is being discharged due to being pleased with her progress.   Gar Ponto MS, PT 12/19/20 10:00 PM   [place], [place], [address] Phone: [phone]   Fax:  [phone]  Name: Teresa Wu MRN: [id number] Date of Birth: 1933-10-18

## 2021-01-01 ENCOUNTER — Encounter: Payer: Medicare Other | Admitting: Physical Therapy

## 2021-01-10 DIAGNOSIS — I10 Essential (primary) hypertension: Secondary | ICD-10-CM | POA: Diagnosis not present

## 2021-01-10 DIAGNOSIS — E119 Type 2 diabetes mellitus without complications: Secondary | ICD-10-CM | POA: Diagnosis not present

## 2021-01-10 DIAGNOSIS — M130 Polyarthritis, unspecified: Secondary | ICD-10-CM | POA: Diagnosis not present

## 2021-03-14 DIAGNOSIS — H04123 Dry eye syndrome of bilateral lacrimal glands: Secondary | ICD-10-CM | POA: Diagnosis not present

## 2021-03-14 DIAGNOSIS — Z961 Presence of intraocular lens: Secondary | ICD-10-CM | POA: Diagnosis not present

## 2021-03-14 DIAGNOSIS — E119 Type 2 diabetes mellitus without complications: Secondary | ICD-10-CM | POA: Diagnosis not present

## 2021-04-09 DIAGNOSIS — I10 Essential (primary) hypertension: Secondary | ICD-10-CM | POA: Diagnosis not present

## 2021-04-09 DIAGNOSIS — E785 Hyperlipidemia, unspecified: Secondary | ICD-10-CM | POA: Diagnosis not present

## 2021-04-09 DIAGNOSIS — M130 Polyarthritis, unspecified: Secondary | ICD-10-CM | POA: Diagnosis not present

## 2021-04-09 DIAGNOSIS — E119 Type 2 diabetes mellitus without complications: Secondary | ICD-10-CM | POA: Diagnosis not present

## 2021-04-13 DIAGNOSIS — W010XXA Fall on same level from slipping, tripping and stumbling without subsequent striking against object, initial encounter: Secondary | ICD-10-CM | POA: Diagnosis not present

## 2021-04-13 DIAGNOSIS — E1169 Type 2 diabetes mellitus with other specified complication: Secondary | ICD-10-CM | POA: Diagnosis not present

## 2021-04-13 DIAGNOSIS — I10 Essential (primary) hypertension: Secondary | ICD-10-CM | POA: Diagnosis not present

## 2021-04-13 DIAGNOSIS — M25562 Pain in left knee: Secondary | ICD-10-CM | POA: Diagnosis not present

## 2021-04-13 DIAGNOSIS — S0990XA Unspecified injury of head, initial encounter: Secondary | ICD-10-CM | POA: Diagnosis not present

## 2021-05-25 DIAGNOSIS — W19XXXD Unspecified fall, subsequent encounter: Secondary | ICD-10-CM | POA: Diagnosis not present

## 2021-05-25 DIAGNOSIS — E1169 Type 2 diabetes mellitus with other specified complication: Secondary | ICD-10-CM | POA: Diagnosis not present

## 2021-05-25 DIAGNOSIS — I10 Essential (primary) hypertension: Secondary | ICD-10-CM | POA: Diagnosis not present

## 2021-05-25 DIAGNOSIS — R239 Unspecified skin changes: Secondary | ICD-10-CM | POA: Diagnosis not present

## 2021-06-10 DIAGNOSIS — E119 Type 2 diabetes mellitus without complications: Secondary | ICD-10-CM | POA: Diagnosis not present

## 2021-06-10 DIAGNOSIS — I10 Essential (primary) hypertension: Secondary | ICD-10-CM | POA: Diagnosis not present

## 2021-07-11 DIAGNOSIS — E119 Type 2 diabetes mellitus without complications: Secondary | ICD-10-CM | POA: Diagnosis not present

## 2021-07-11 DIAGNOSIS — I10 Essential (primary) hypertension: Secondary | ICD-10-CM | POA: Diagnosis not present

## 2021-08-21 DIAGNOSIS — I10 Essential (primary) hypertension: Secondary | ICD-10-CM | POA: Diagnosis not present

## 2021-08-21 DIAGNOSIS — E785 Hyperlipidemia, unspecified: Secondary | ICD-10-CM | POA: Diagnosis not present

## 2021-08-21 DIAGNOSIS — E119 Type 2 diabetes mellitus without complications: Secondary | ICD-10-CM | POA: Diagnosis not present

## 2021-08-24 DIAGNOSIS — N189 Chronic kidney disease, unspecified: Secondary | ICD-10-CM | POA: Diagnosis not present

## 2021-08-24 DIAGNOSIS — E785 Hyperlipidemia, unspecified: Secondary | ICD-10-CM | POA: Diagnosis not present

## 2021-08-24 DIAGNOSIS — Z0000 Encounter for general adult medical examination without abnormal findings: Secondary | ICD-10-CM | POA: Diagnosis not present

## 2021-08-24 DIAGNOSIS — M21611 Bunion of right foot: Secondary | ICD-10-CM | POA: Diagnosis not present

## 2021-08-24 DIAGNOSIS — H900 Conductive hearing loss, bilateral: Secondary | ICD-10-CM | POA: Diagnosis not present

## 2021-08-24 DIAGNOSIS — E1169 Type 2 diabetes mellitus with other specified complication: Secondary | ICD-10-CM | POA: Diagnosis not present

## 2021-08-24 DIAGNOSIS — I10 Essential (primary) hypertension: Secondary | ICD-10-CM | POA: Diagnosis not present

## 2021-09-25 ENCOUNTER — Ambulatory Visit: Payer: Medicare Other | Admitting: Podiatry

## 2021-12-20 DIAGNOSIS — Z1231 Encounter for screening mammogram for malignant neoplasm of breast: Secondary | ICD-10-CM | POA: Diagnosis not present

## 2021-12-25 DIAGNOSIS — E1169 Type 2 diabetes mellitus with other specified complication: Secondary | ICD-10-CM | POA: Diagnosis not present

## 2021-12-25 DIAGNOSIS — I10 Essential (primary) hypertension: Secondary | ICD-10-CM | POA: Diagnosis not present

## 2021-12-25 DIAGNOSIS — M189 Osteoarthritis of first carpometacarpal joint, unspecified: Secondary | ICD-10-CM | POA: Diagnosis not present

## 2021-12-25 DIAGNOSIS — Z6824 Body mass index (BMI) 24.0-24.9, adult: Secondary | ICD-10-CM | POA: Diagnosis not present

## 2021-12-25 DIAGNOSIS — E785 Hyperlipidemia, unspecified: Secondary | ICD-10-CM | POA: Diagnosis not present

## 2021-12-25 DIAGNOSIS — I129 Hypertensive chronic kidney disease with stage 1 through stage 4 chronic kidney disease, or unspecified chronic kidney disease: Secondary | ICD-10-CM | POA: Diagnosis not present

## 2021-12-25 DIAGNOSIS — M130 Polyarthritis, unspecified: Secondary | ICD-10-CM | POA: Diagnosis not present

## 2021-12-25 DIAGNOSIS — N189 Chronic kidney disease, unspecified: Secondary | ICD-10-CM | POA: Diagnosis not present

## 2021-12-25 DIAGNOSIS — E782 Mixed hyperlipidemia: Secondary | ICD-10-CM | POA: Diagnosis not present

## 2022-03-21 DIAGNOSIS — H04123 Dry eye syndrome of bilateral lacrimal glands: Secondary | ICD-10-CM | POA: Diagnosis not present

## 2022-03-21 DIAGNOSIS — Z961 Presence of intraocular lens: Secondary | ICD-10-CM | POA: Diagnosis not present

## 2022-03-21 DIAGNOSIS — E119 Type 2 diabetes mellitus without complications: Secondary | ICD-10-CM | POA: Diagnosis not present

## 2022-03-29 IMAGING — CT CT ABD-PELV W/O CM
2 of 4 series · 17 of 46 positions shown, 19 images · non-contrast
Comparison: CT abdomen July 29, 2006

CLINICAL DATA: LLQ pain X 3 days, denies any NV, some diarrhea 4-5
days ago, concern for diverticulitis

EXAM:
CT ABDOMEN AND PELVIS WITHOUT CONTRAST
TECHNIQUE: Multidetector CT imaging of the abdomen and pelvis was performed
following the standard protocol without IV contrast.

[Series 2: axial st · axial · 0.73mm/px · z∈[+893,+1243]mm · 14 of 78 slices shown, 16 images]
[im 4/78  soft-tissue]
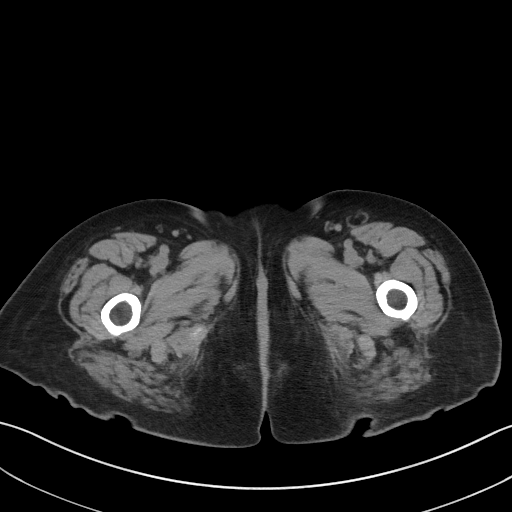
[im 4/78  bone]
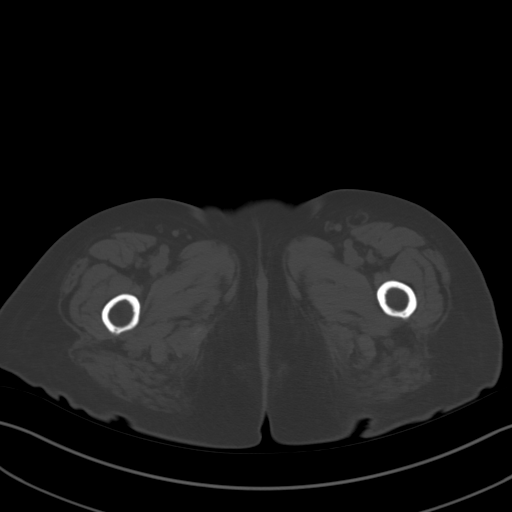
[im 10/78  soft-tissue]
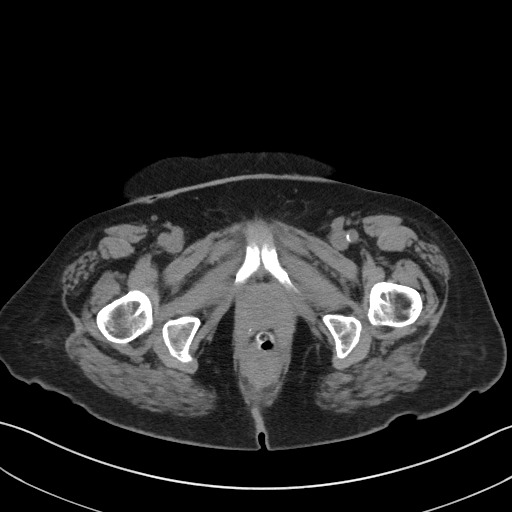
[im 16/78  soft-tissue]
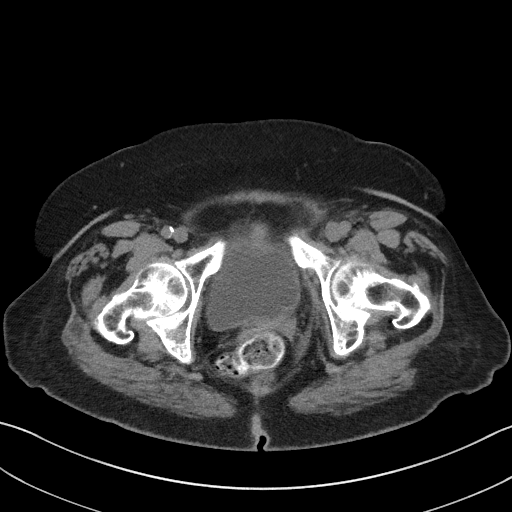
[im 22/78  soft-tissue]
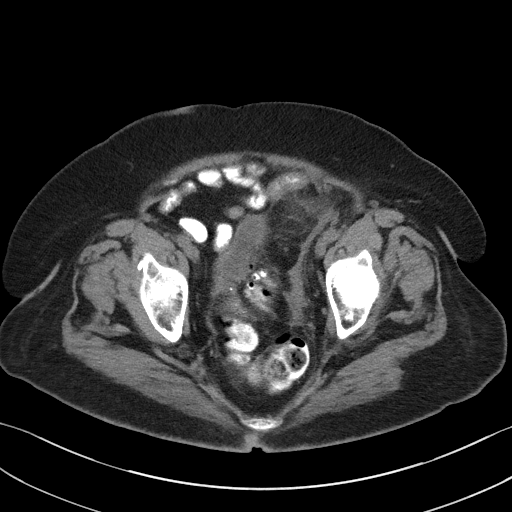
[im 25/78  soft-tissue]
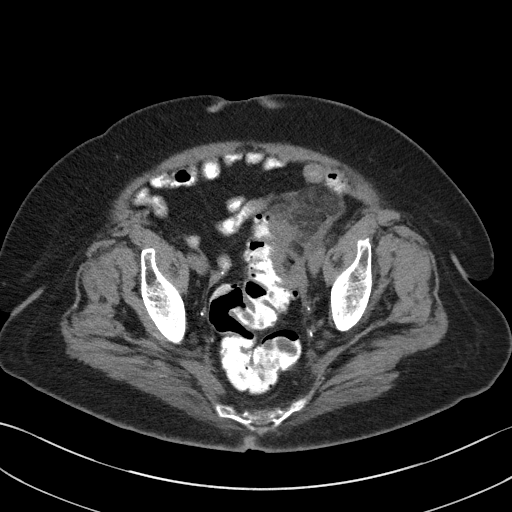
[im 31/78  soft-tissue]
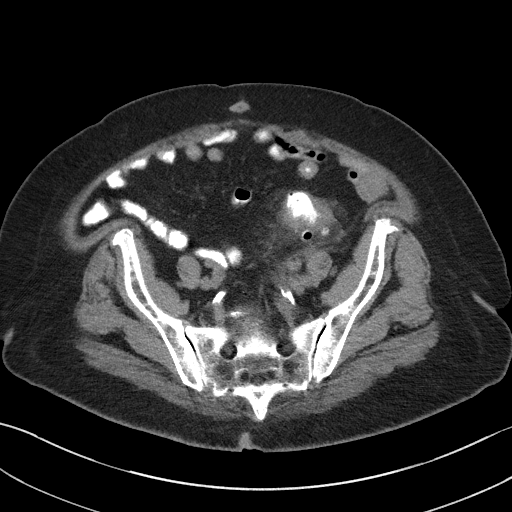
[im 37/78  soft-tissue]
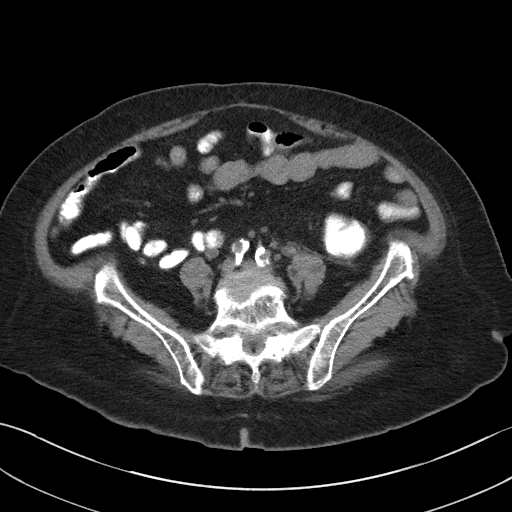
[im 41/78  soft-tissue]
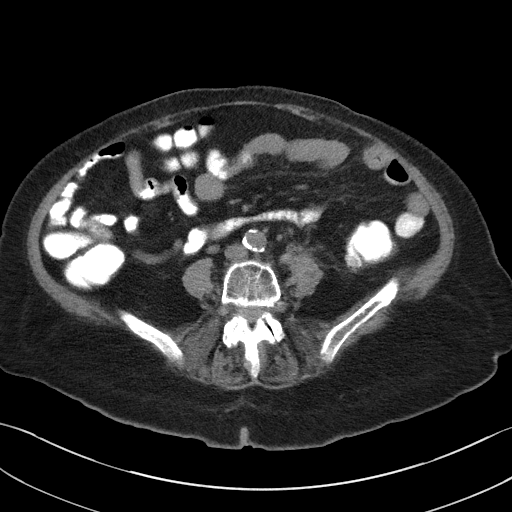
[im 47/78  soft-tissue]
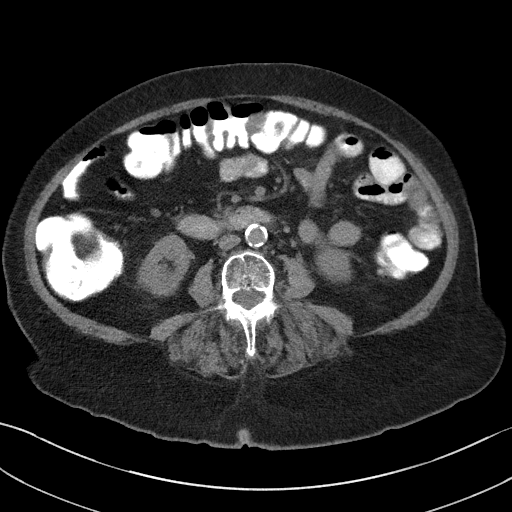
[im 47/78  bone]
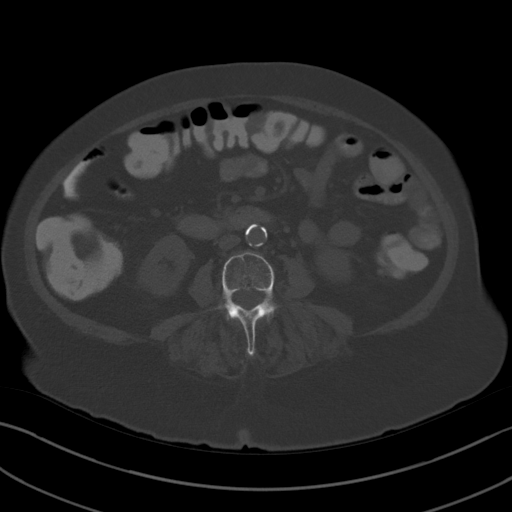
[im 53/78  soft-tissue]
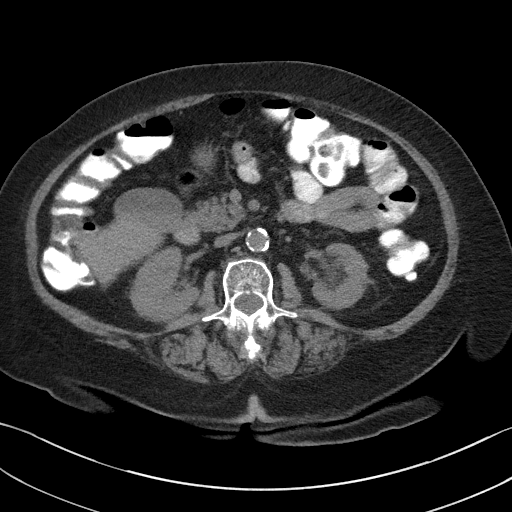
[im 59/78  soft-tissue]
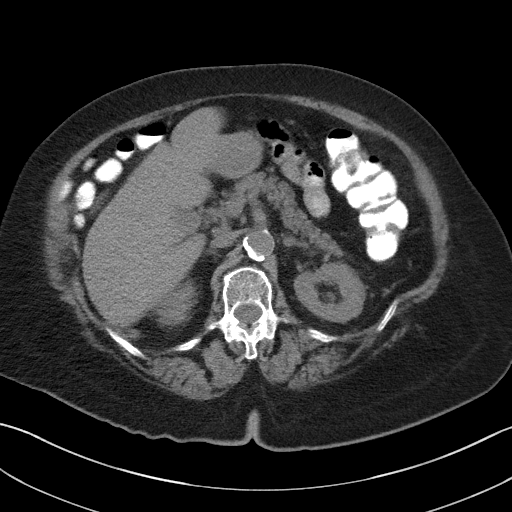
[im 62/78  soft-tissue]
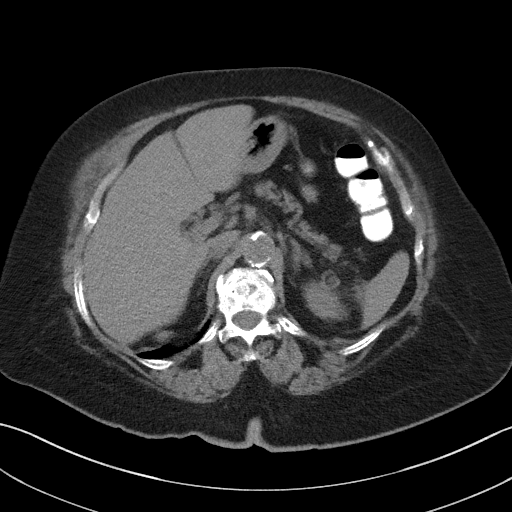
[im 68/78  soft-tissue]
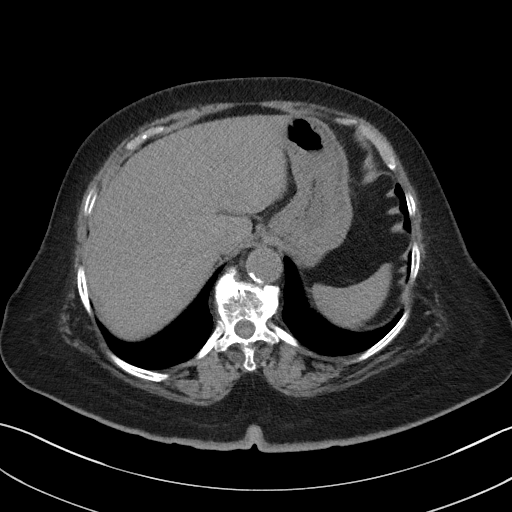
[im 74/78  soft-tissue]
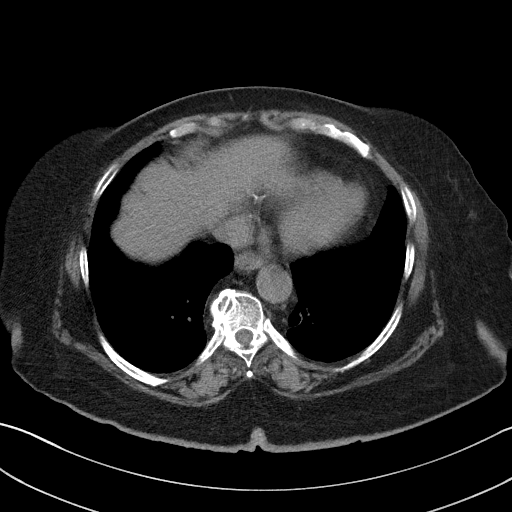

[Series 5: coronal st · coronal · 0.70mm/px · 3 of 97 slices shown]
[im 33/97  soft-tissue]
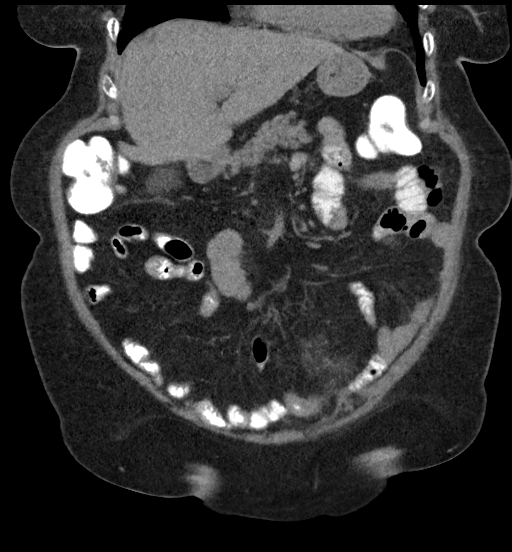
[im 43/97  soft-tissue]
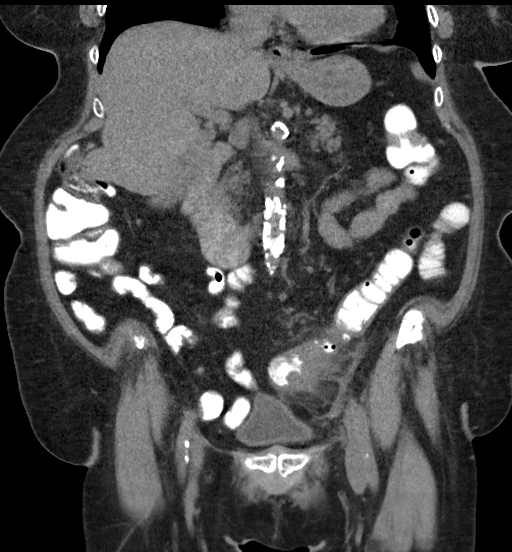
[im 54/97  soft-tissue]
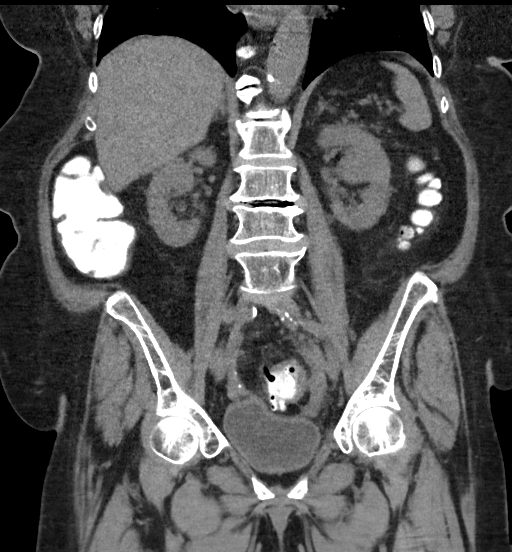

[17 of 46 positions shown; findings below may reference images not displayed]

FINDINGS: Lower chest: No acute abnormality. Small hiatal hernia. Aortic
atherosclerosis. Normal size heart. No pericardial effusion.

Hepatobiliary: Unremarkable noncontrast appearance of the hepatic
parenchyma. Gallbladder is unremarkable. No biliary ductal
dilatation.

Pancreas: Unremarkable

Spleen: Unremarkable

Adrenals/Urinary Tract: Adrenal glands are unremarkable. Kidneys are
normal, without renal calculi, focal lesion, or hydronephrosis.
Bladder is unremarkable.

Stomach/Bowel: Small hiatal hernia otherwise the stomach appears
within normal limits. No suspicious small bowel dilation. Normal
appendix. Colonic diverticulosis with an inflamed sigmoid colonic
diverticula short segment colonic wall thickening and pericolonic
fat stranding. No pneumoperitoneum or extraluminal enteric contrast
to suggest perforation. No wall pelvic fluid collections.

Vascular/Lymphatic: Aortic atherosclerosis. No enlarged abdominal or
pelvic lymph nodes.

Reproductive: No suspicious pelvic masses.

Other: No abdominal hernia.

Musculoskeletal: Multilevel degenerative changes spine. No acute
osseous abnormality.
IMPRESSION: 1. Acute uncomplicated sigmoid diverticulitis.
2. Small hiatal hernia.
3. Aortic atherosclerosis.  Aortic Atherosclerosis ([id number]).

## 2022-04-23 DIAGNOSIS — E1169 Type 2 diabetes mellitus with other specified complication: Secondary | ICD-10-CM | POA: Diagnosis not present

## 2022-04-23 DIAGNOSIS — I10 Essential (primary) hypertension: Secondary | ICD-10-CM | POA: Diagnosis not present

## 2022-04-23 DIAGNOSIS — I129 Hypertensive chronic kidney disease with stage 1 through stage 4 chronic kidney disease, or unspecified chronic kidney disease: Secondary | ICD-10-CM | POA: Diagnosis not present

## 2022-04-23 DIAGNOSIS — E785 Hyperlipidemia, unspecified: Secondary | ICD-10-CM | POA: Diagnosis not present

## 2022-04-23 DIAGNOSIS — Z6824 Body mass index (BMI) 24.0-24.9, adult: Secondary | ICD-10-CM | POA: Diagnosis not present

## 2022-08-23 DIAGNOSIS — N1832 Chronic kidney disease, stage 3b: Secondary | ICD-10-CM | POA: Diagnosis not present

## 2022-08-23 DIAGNOSIS — E1169 Type 2 diabetes mellitus with other specified complication: Secondary | ICD-10-CM | POA: Diagnosis not present

## 2022-08-23 DIAGNOSIS — E782 Mixed hyperlipidemia: Secondary | ICD-10-CM | POA: Diagnosis not present

## 2022-08-23 DIAGNOSIS — H900 Conductive hearing loss, bilateral: Secondary | ICD-10-CM | POA: Diagnosis not present

## 2022-08-23 DIAGNOSIS — Z0001 Encounter for general adult medical examination with abnormal findings: Secondary | ICD-10-CM | POA: Diagnosis not present

## 2022-08-23 DIAGNOSIS — M150 Primary generalized (osteo)arthritis: Secondary | ICD-10-CM | POA: Diagnosis not present

## 2022-08-23 DIAGNOSIS — N390 Urinary tract infection, site not specified: Secondary | ICD-10-CM | POA: Diagnosis not present

## 2022-08-23 DIAGNOSIS — M130 Polyarthritis, unspecified: Secondary | ICD-10-CM | POA: Diagnosis not present

## 2022-08-23 DIAGNOSIS — I129 Hypertensive chronic kidney disease with stage 1 through stage 4 chronic kidney disease, or unspecified chronic kidney disease: Secondary | ICD-10-CM | POA: Diagnosis not present

## 2022-09-06 DIAGNOSIS — N390 Urinary tract infection, site not specified: Secondary | ICD-10-CM | POA: Diagnosis not present

## 2022-09-06 DIAGNOSIS — E1169 Type 2 diabetes mellitus with other specified complication: Secondary | ICD-10-CM | POA: Diagnosis not present

## 2022-09-20 ENCOUNTER — Ambulatory Visit: Payer: Medicare Other | Admitting: Podiatry

## 2022-09-23 ENCOUNTER — Encounter: Payer: Self-pay | Admitting: Podiatry

## 2022-09-23 ENCOUNTER — Ambulatory Visit: Payer: Medicare Other | Admitting: Podiatry

## 2022-09-23 DIAGNOSIS — M7752 Other enthesopathy of left foot: Secondary | ICD-10-CM | POA: Diagnosis not present

## 2022-09-23 DIAGNOSIS — M7751 Other enthesopathy of right foot: Secondary | ICD-10-CM | POA: Diagnosis not present

## 2022-09-23 MED ORDER — METHYLPREDNISOLONE 4 MG PO TBPK: ORAL_TABLET | ORAL | 0 refills | Status: AC

## 2022-09-23 NOTE — Patient Instructions (Signed)
Low-Purine Eating Plan A low-purine eating plan involves making food choices to limit your purine intake. Purine is a kind of uric acid. Too much uric acid in your blood can cause certain conditions, such as gout and kidney stones. Eating a low-purine diet may help control these conditions. What are tips for following this plan? Shopping Avoid buying products that contain high-fructose corn syrup. Check for this on food labels. It is commonly found in many processed foods and soft drinks. Be sure to check for it in baked goods such as cookies, canned fruits, and cereals and cereal bars. Avoid buying veal, chicken breast with skin, lamb, and organ meats such as liver. These types of meats tend to have the highest purine content. Choose dairy products. These may lower uric acid levels. Avoid certain types of fish. Not all fish and seafood have high purine content. Examples with high purine content include anchovies, trout, tuna, sardines, and salmon. Avoid buying beverages that contain alcohol, particularly beer and hard liquor. Alcohol can affect the way your body gets rid of uric acid. Meal planning  Learn which foods do or do not affect you. If you find out that a food tends to cause your gout symptoms to flare up, avoid eating that food. You can enjoy foods that do not cause problems. If you have any questions about a food item, talk with your dietitian or health care provider. Reduce the overall amount of meat in your diet. When you do eat meat, choose ones with lower purine content. Include plenty of fruits and vegetables. Although some vegetables may have a high purine content--such as asparagus, mushrooms, spinach, or cauliflower--it has been shown that these do not contribute to uric acid blood levels as much. Consume at least 1 dairy serving a day. This has been shown to decrease uric acid levels. General information If you drink alcohol: Limit how much you have to: 0-1 drink a day for  women who are not pregnant. 0-2 drinks a day for men. Know how much alcohol is in a drink. In the [place] one drink equals one 12 oz bottle of beer (355 mL), one 5 oz glass of wine (148 mL), or one 1 oz glass of hard liquor (44 mL). Drink plenty of water. Try to drink enough to keep your urine pale yellow. Fluids can help remove uric acid from your body. Work with your health care provider and dietitian to develop a plan to achieve or maintain a healthy weight. Losing weight may help reduce uric acid in your blood. What foods are recommended? The following are some types of foods that are good choices when limiting purine intake: Fresh or frozen fruits and vegetables. Whole grains, breads, cereals, and pasta. Rice. Beans, peas, legumes. Nuts and seeds. Dairy products. Fats and oils. The items listed above may not be a complete list. Talk with a dietitian about what dietary choices are best for you. What foods are not recommended? Limit your intake of foods high in purines, including: Beer and other alcohol. Meat-based gravy or sauce. Canned or fresh fish, such as: Anchovies, sardines, herring, salmon, and tuna. Mussels and scallops. Codfish, trout, and haddock. Bacon, veal, chicken breast with skin, and lamb. Organ meats, such as: Liver or kidney. Tripe. Sweetbreads (thymus gland or pancreas). Wild Education officer, environmental. Yeast or yeast extract supplements. Drinks sweetened with high-fructose corn syrup, such as soda. Processed foods made with high-fructose corn syrup. The items listed above may not be a complete list of foods  and beverages you should limit. Contact a dietitian for more information. Summary Eating a low-purine diet may help control conditions caused by too much uric acid in the body, such as gout or kidney stones. Choose low-purine foods, limit alcohol, and limit high-fructose corn syrup. You will learn over time which foods do or do not affect you. If you find out that a  food tends to cause your gout symptoms to flare up, avoid eating that food. This information is not intended to replace advice given to you by your health care provider. Make sure you discuss any questions you have with your health care provider. Document Revised: 01/11/2021 Document Reviewed: 01/11/2021 Elsevier Patient Education  2024 [employer].

## 2022-09-23 NOTE — Progress Notes (Signed)
  Subjective:  Patient ID: Teresa Wu, female    DOB: Mar 05, 1933,   MRN: [id number]  Chief Complaint  Patient presents with   Foot Pain    BILAT FOOT PAIN, BILAT ANKLE SWELLING,  ON AND OFF PAIN FOR 3 WKS , PAIN LEVEL 7/10, KEEPING THEM ELEVATED, PRN TYLENOL, HX OF GOUT    89 y.o. female presents for as above. Relates this might be one or two times of flares in the past 6 months. Denies any current treatments . Denies any other pedal complaints. Denies n/v/f/c.   Past Medical History:  Diagnosis Date   Arthritis    Diabetes mellitus    Hyperlipemia    Hypertension    Wears glasses     Objective:  Physical Exam: Vascular: DP/PT pulses 2/4 bilateral. CFT <3 seconds. Normal hair growth on digits. No edema.  Skin. No lacerations or abrasions bilateral feet.  Musculoskeletal: MMT 5/5 bilateral lower extremities in DF, PF, Inversion and Eversion. Deceased ROM in DF of ankle joint. Bilateral ankles with edema and tender to touch on the right ankle region and around the left ankle and midfoot area into the medial arch.  Neurological: Sensation intact to light touch.   Assessment:   1. Capsulitis of left ankle   2. Capsulitis of right ankle      Plan:  Patient was evaluated and treated and all questions answered. -Xrays reviewed. Reviewed from previous. Midfoot arthirits noted.  -Discussed treatement options for gouty arthritis and gout education provided. -Patient deferred injection today -Discussed diet and modifications.  -Rx Medrol dose pack provided . -Previous uric acid was 7.5 -Advised patient to call if symptoms are not improved within 1 week -Patient to return in 3 weeks for re-check/further discussion for long term management of gout or sooner if condition worsens.   Louann Sjogren, DPM

## 2022-10-21 ENCOUNTER — Ambulatory Visit: Payer: Medicare Other | Admitting: Podiatry

## 2023-01-03 DIAGNOSIS — I10 Essential (primary) hypertension: Secondary | ICD-10-CM | POA: Diagnosis not present

## 2023-01-03 DIAGNOSIS — K591 Functional diarrhea: Secondary | ICD-10-CM | POA: Diagnosis not present

## 2023-01-03 DIAGNOSIS — R197 Diarrhea, unspecified: Secondary | ICD-10-CM | POA: Diagnosis not present

## 2023-01-03 DIAGNOSIS — N189 Chronic kidney disease, unspecified: Secondary | ICD-10-CM | POA: Diagnosis not present

## 2023-01-03 DIAGNOSIS — E1169 Type 2 diabetes mellitus with other specified complication: Secondary | ICD-10-CM | POA: Diagnosis not present

## 2023-01-06 DIAGNOSIS — I10 Essential (primary) hypertension: Secondary | ICD-10-CM | POA: Diagnosis not present

## 2023-01-06 DIAGNOSIS — E1169 Type 2 diabetes mellitus with other specified complication: Secondary | ICD-10-CM | POA: Diagnosis not present

## 2023-01-06 DIAGNOSIS — K591 Functional diarrhea: Secondary | ICD-10-CM | POA: Diagnosis not present

## 2023-01-21 DIAGNOSIS — Z1231 Encounter for screening mammogram for malignant neoplasm of breast: Secondary | ICD-10-CM | POA: Diagnosis not present

## 2023-01-24 DIAGNOSIS — E1169 Type 2 diabetes mellitus with other specified complication: Secondary | ICD-10-CM | POA: Diagnosis not present

## 2023-01-24 DIAGNOSIS — I10 Essential (primary) hypertension: Secondary | ICD-10-CM | POA: Diagnosis not present

## 2023-01-24 DIAGNOSIS — K529 Noninfective gastroenteritis and colitis, unspecified: Secondary | ICD-10-CM | POA: Diagnosis not present

## 2023-03-25 DIAGNOSIS — E119 Type 2 diabetes mellitus without complications: Secondary | ICD-10-CM | POA: Diagnosis not present

## 2023-03-25 DIAGNOSIS — H04123 Dry eye syndrome of bilateral lacrimal glands: Secondary | ICD-10-CM | POA: Diagnosis not present

## 2023-04-25 DIAGNOSIS — E1169 Type 2 diabetes mellitus with other specified complication: Secondary | ICD-10-CM | POA: Diagnosis not present

## 2023-04-25 DIAGNOSIS — I10 Essential (primary) hypertension: Secondary | ICD-10-CM | POA: Diagnosis not present

## 2023-04-25 DIAGNOSIS — N189 Chronic kidney disease, unspecified: Secondary | ICD-10-CM | POA: Diagnosis not present

## 2023-04-25 DIAGNOSIS — I129 Hypertensive chronic kidney disease with stage 1 through stage 4 chronic kidney disease, or unspecified chronic kidney disease: Secondary | ICD-10-CM | POA: Diagnosis not present

## 2023-04-25 DIAGNOSIS — E1122 Type 2 diabetes mellitus with diabetic chronic kidney disease: Secondary | ICD-10-CM | POA: Diagnosis not present

## 2023-04-25 DIAGNOSIS — R0602 Shortness of breath: Secondary | ICD-10-CM | POA: Diagnosis not present

## 2023-06-06 DIAGNOSIS — R600 Localized edema: Secondary | ICD-10-CM | POA: Diagnosis not present

## 2023-06-06 DIAGNOSIS — E1122 Type 2 diabetes mellitus with diabetic chronic kidney disease: Secondary | ICD-10-CM | POA: Diagnosis not present

## 2023-06-06 DIAGNOSIS — N189 Chronic kidney disease, unspecified: Secondary | ICD-10-CM | POA: Diagnosis not present

## 2023-06-06 DIAGNOSIS — I129 Hypertensive chronic kidney disease with stage 1 through stage 4 chronic kidney disease, or unspecified chronic kidney disease: Secondary | ICD-10-CM | POA: Diagnosis not present

## 2023-07-08 DIAGNOSIS — I129 Hypertensive chronic kidney disease with stage 1 through stage 4 chronic kidney disease, or unspecified chronic kidney disease: Secondary | ICD-10-CM | POA: Diagnosis not present

## 2023-07-08 DIAGNOSIS — E1122 Type 2 diabetes mellitus with diabetic chronic kidney disease: Secondary | ICD-10-CM | POA: Diagnosis not present

## 2023-07-08 DIAGNOSIS — N189 Chronic kidney disease, unspecified: Secondary | ICD-10-CM | POA: Diagnosis not present

## 2023-07-08 DIAGNOSIS — J393 Upper respiratory tract hypersensitivity reaction, site unspecified: Secondary | ICD-10-CM | POA: Diagnosis not present

## 2023-07-08 DIAGNOSIS — R600 Localized edema: Secondary | ICD-10-CM | POA: Diagnosis not present

## 2023-08-25 DIAGNOSIS — E1169 Type 2 diabetes mellitus with other specified complication: Secondary | ICD-10-CM | POA: Diagnosis not present

## 2023-08-25 DIAGNOSIS — E782 Mixed hyperlipidemia: Secondary | ICD-10-CM | POA: Diagnosis not present

## 2023-08-25 DIAGNOSIS — R600 Localized edema: Secondary | ICD-10-CM | POA: Diagnosis not present

## 2023-08-25 DIAGNOSIS — I10 Essential (primary) hypertension: Secondary | ICD-10-CM | POA: Diagnosis not present

## 2023-11-03 DIAGNOSIS — R6510 Systemic inflammatory response syndrome (SIRS) of non-infectious origin without acute organ dysfunction: Secondary | ICD-10-CM | POA: Diagnosis not present

## 2023-11-03 DIAGNOSIS — M1250 Traumatic arthropathy, unspecified site: Secondary | ICD-10-CM | POA: Diagnosis not present

## 2023-11-03 DIAGNOSIS — E782 Mixed hyperlipidemia: Secondary | ICD-10-CM | POA: Diagnosis not present

## 2023-11-03 DIAGNOSIS — E1122 Type 2 diabetes mellitus with diabetic chronic kidney disease: Secondary | ICD-10-CM | POA: Diagnosis not present

## 2023-11-03 DIAGNOSIS — I129 Hypertensive chronic kidney disease with stage 1 through stage 4 chronic kidney disease, or unspecified chronic kidney disease: Secondary | ICD-10-CM | POA: Diagnosis not present

## 2023-11-03 DIAGNOSIS — I10 Essential (primary) hypertension: Secondary | ICD-10-CM | POA: Diagnosis not present

## 2023-11-03 DIAGNOSIS — N1832 Chronic kidney disease, stage 3b: Secondary | ICD-10-CM | POA: Diagnosis not present

## 2023-11-03 DIAGNOSIS — E785 Hyperlipidemia, unspecified: Secondary | ICD-10-CM | POA: Diagnosis not present

## 2023-11-03 DIAGNOSIS — M130 Polyarthritis, unspecified: Secondary | ICD-10-CM | POA: Diagnosis not present

## 2023-11-13 DIAGNOSIS — E1169 Type 2 diabetes mellitus with other specified complication: Secondary | ICD-10-CM | POA: Diagnosis not present

## 2023-11-13 DIAGNOSIS — M1250 Traumatic arthropathy, unspecified site: Secondary | ICD-10-CM | POA: Diagnosis not present

## 2023-11-13 DIAGNOSIS — E785 Hyperlipidemia, unspecified: Secondary | ICD-10-CM | POA: Diagnosis not present
